# Patient Record
Sex: Female | Born: 1985
Health system: Southern US, Community
[De-identification: ages and names within clinical notes are randomized; demographics above are authoritative.]

## PROBLEM LIST (undated history)

## (undated) DIAGNOSIS — K219 Gastro-esophageal reflux disease without esophagitis: Secondary | ICD-10-CM

## (undated) DIAGNOSIS — B019 Varicella without complication: Secondary | ICD-10-CM

## (undated) DIAGNOSIS — Z9889 Other specified postprocedural states: Secondary | ICD-10-CM

## (undated) DIAGNOSIS — N390 Urinary tract infection, site not specified: Secondary | ICD-10-CM

## (undated) DIAGNOSIS — O24419 Gestational diabetes mellitus in pregnancy, unspecified control: Secondary | ICD-10-CM

## (undated) DIAGNOSIS — R7611 Nonspecific reaction to tuberculin skin test without active tuberculosis: Secondary | ICD-10-CM

## (undated) HISTORY — DX: Urinary tract infection, site not specified: N39.0

## (undated) HISTORY — DX: Varicella without complication: B01.9

## (undated) HISTORY — DX: Nonspecific reaction to tuberculin skin test without active tuberculosis: R76.11

---

## 2015-10-18 ENCOUNTER — Ambulatory Visit: Payer: Self-pay | Admitting: Primary Care

## 2015-10-22 ENCOUNTER — Encounter: Payer: Self-pay | Admitting: Primary Care

## 2015-10-22 ENCOUNTER — Ambulatory Visit (INDEPENDENT_AMBULATORY_CARE_PROVIDER_SITE_OTHER): Payer: Managed Care, Other (non HMO) | Admitting: Primary Care

## 2015-10-22 VITALS — BP 118/72 | HR 73 | Temp 98.0°F | Ht 59.5 in | Wt 119.1 lb

## 2015-10-22 DIAGNOSIS — Z7189 Other specified counseling: Secondary | ICD-10-CM

## 2015-10-22 DIAGNOSIS — Z7689 Persons encountering health services in other specified circumstances: Secondary | ICD-10-CM

## 2015-10-22 NOTE — Progress Notes (Signed)
Pre visit review using our clinic review tool, if applicable. No additional management support is needed unless otherwise documented below in the visit note. 

## 2015-10-22 NOTE — Progress Notes (Signed)
Subjective:    Patient ID: Dawn CrosbyJosephine Rivers, female    DOB: 04/18/1986, 30 y.o.   MRN: [id number]  HPI  Dawn Rivers is a 30 year old female who presents today to establish care. Will obtain old records. Her last physical was years ago. Her last pap was last week and was normal. She has no complaints today.  1) Positive TB Test: Tested positive in 2008 after traveling, was on INH for 9 months. Chest xray negative. Never tested positive since. She completes an annual TB quantiferion blood testing. Denies cough, night sweats, fatigue.  Review of Systems  Constitutional: Negative for unexpected weight change.  HENT: Negative for rhinorrhea.   Respiratory: Negative for cough and shortness of breath.   Cardiovascular: Negative for chest pain.  Gastrointestinal: Negative for diarrhea and constipation.  Genitourinary:       Spotting periods, IUD  Musculoskeletal: Negative for myalgias and arthralgias.  Allergic/Immunologic: Negative for environmental allergies.  Neurological: Negative for dizziness, numbness and headaches.  Psychiatric/Behavioral:       Denies concerns for anxiety or depression       Past Medical History  Diagnosis Date  . Urinary tract infection   . Chicken pox   . Positive TB test     Social History   Social History  . Marital Status: Married    Spouse Name: N/A  . Number of Children: N/A  . Years of Education: N/A   Occupational History  . Not on file.   Social History Main Topics  . Smoking status: Current Some Day Smoker    Types: Cigarettes  . Smokeless tobacco: Not on file     Comment: 2 to 3 in a few days  . Alcohol Use: No  . Drug Use: Not on file  . Sexual Activity: Not on file   Other Topics Concern  . Not on file   Social History Narrative   Married.   1 child.   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   Spending time with her family, running, reading.        Past Surgical History  Procedure Laterality Date  . Cesarean section       Family History  Problem Relation Age of Onset  . Hypertension Mother   . Hypertension Father   . Diabetes Father   . Hyperlipidemia Father   . Sleep apnea Father   . GER disease Father   . Hypertension Brother     No Known Allergies  No current outpatient prescriptions on file prior to visit.   No current facility-administered medications on file prior to visit.    BP 118/72 mmHg  Pulse 73  Temp(Src) 98 F (36.7 C) (Oral)  Ht 4' 11.5" (1.511 m)  Wt 119 lb 1.9 oz (54.032 kg)  BMI 23.67 kg/m2  SpO2 99%    Objective:   Physical Exam  Constitutional: She is oriented to person, place, and time. She appears well-nourished.  Cardiovascular: Normal rate and regular rhythm.   Pulmonary/Chest: Effort normal and breath sounds normal.  Neurological: She is alert and oriented to person, place, and time.  Skin: Skin is warm and dry.  Psychiatric: She has a normal mood and affect.          Assessment & Plan:  Establish Care:  No recent care from PCP. Last physical several years ago. Follows with GYN, last Pap one week ago and was normal per patient. No PMH. Reviewed FH and Social History. Follow up in 3-6  months for physical.

## 2015-10-22 NOTE — Patient Instructions (Signed)
Please schedule a physical with me within 3-6 months. You may also schedule a lab only appointment 3-4 days prior. We will discuss your lab results in detail during your physical.  It was a pleasure to meet you today! Please don't hesitate to call me with any questions. Welcome to [facility]!

## 2016-01-16 ENCOUNTER — Other Ambulatory Visit: Payer: Self-pay | Admitting: Primary Care

## 2016-01-16 DIAGNOSIS — Z0000 Encounter for general adult medical examination without abnormal findings: Secondary | ICD-10-CM

## 2016-01-20 ENCOUNTER — Other Ambulatory Visit: Payer: Managed Care, Other (non HMO)

## 2016-01-20 ENCOUNTER — Other Ambulatory Visit (INDEPENDENT_AMBULATORY_CARE_PROVIDER_SITE_OTHER): Payer: Managed Care, Other (non HMO)

## 2016-01-20 DIAGNOSIS — Z0000 Encounter for general adult medical examination without abnormal findings: Secondary | ICD-10-CM | POA: Diagnosis not present

## 2016-01-20 LAB — CBC
HCT: 41.4 % (ref 36.0–46.0)
HEMOGLOBIN: 14 g/dL (ref 12.0–15.0)
MCHC: 33.8 g/dL (ref 30.0–36.0)
MCV: 90.9 fl (ref 78.0–100.0)
Platelets: 287 10*3/uL (ref 150.0–400.0)
RBC: 4.55 Mil/uL (ref 3.87–5.11)
RDW: 12.1 % (ref 11.5–15.5)
WBC: 10.1 10*3/uL (ref 4.0–10.5)

## 2016-01-20 LAB — TSH: TSH: 1.13 u[IU]/mL (ref 0.35–4.50)

## 2016-01-20 LAB — COMPREHENSIVE METABOLIC PANEL
ALT: 337 U/L — AB (ref 0–35)
AST: 182 U/L — ABNORMAL HIGH (ref 0–37)
Albumin: 4.6 g/dL (ref 3.5–5.2)
Alkaline Phosphatase: 97 U/L (ref 39–117)
BILIRUBIN TOTAL: 1 mg/dL (ref 0.2–1.2)
BUN: 9 mg/dL (ref 6–23)
CHLORIDE: 102 meq/L (ref 96–112)
CO2: 28 meq/L (ref 19–32)
Calcium: 9.5 mg/dL (ref 8.4–10.5)
Creatinine, Ser: 0.53 mg/dL (ref 0.40–1.20)
GFR: 143.15 mL/min (ref >60.00)
GLUCOSE: 86 mg/dL (ref 70–99)
Potassium: 3.8 mEq/L (ref 3.5–5.1)
Sodium: 137 mEq/L (ref 135–145)
Total Protein: 7.6 g/dL (ref 6.0–8.3)

## 2016-01-20 LAB — LIPID PANEL
CHOL/HDL RATIO: 3
Cholesterol: 169 mg/dL (ref 0–200)
HDL: 48.6 mg/dL (ref >39.00)
LDL CALC: 103 mg/dL — AB (ref 0–99)
NONHDL: 120.53
Triglycerides: 88 mg/dL (ref 0.0–149.0)
VLDL: 17.6 mg/dL (ref 0.0–40.0)

## 2016-01-24 ENCOUNTER — Encounter: Payer: Self-pay | Admitting: Primary Care

## 2016-01-24 ENCOUNTER — Other Ambulatory Visit: Payer: Self-pay | Admitting: Primary Care

## 2016-01-24 ENCOUNTER — Ambulatory Visit (INDEPENDENT_AMBULATORY_CARE_PROVIDER_SITE_OTHER): Payer: Managed Care, Other (non HMO) | Admitting: Primary Care

## 2016-01-24 VITALS — BP 106/64 | HR 80 | Temp 98.7°F | Wt 123.0 lb

## 2016-01-24 DIAGNOSIS — Z0001 Encounter for general adult medical examination with abnormal findings: Secondary | ICD-10-CM

## 2016-01-24 DIAGNOSIS — Z0000 Encounter for general adult medical examination without abnormal findings: Secondary | ICD-10-CM

## 2016-01-24 DIAGNOSIS — R945 Abnormal results of liver function studies: Principal | ICD-10-CM

## 2016-01-24 DIAGNOSIS — R7989 Other specified abnormal findings of blood chemistry: Secondary | ICD-10-CM

## 2016-01-24 HISTORY — DX: Other specified abnormal findings of blood chemistry: R79.89

## 2016-01-24 LAB — HEPATIC FUNCTION PANEL
ALBUMIN: 4.4 g/dL (ref 3.5–5.2)
ALK PHOS: 90 U/L (ref 39–117)
ALT: 171 U/L — ABNORMAL HIGH (ref 0–35)
AST: 48 U/L — ABNORMAL HIGH (ref 0–37)
BILIRUBIN DIRECT: 0.1 mg/dL (ref 0.0–0.3)
BILIRUBIN TOTAL: 0.7 mg/dL (ref 0.2–1.2)
Total Protein: 7.3 g/dL (ref 6.0–8.3)

## 2016-01-24 NOTE — Assessment & Plan Note (Signed)
Tdap and Flu UTD. Diet is fair, some junk food consumption during night shifts at work. Exam unremarkable. Labs with elevated LFTs, repeat today. Discussed the importance of a healthy diet and regular exercise to reduce risk of other medical diseases.  Follow up in 1 year for repeat physical.

## 2016-01-24 NOTE — Progress Notes (Signed)
Pre visit review using our clinic review tool, if applicable. No additional management support is needed unless otherwise documented below in the visit note. 

## 2016-01-24 NOTE — Progress Notes (Signed)
Subjective:    Patient ID: Avilyn Virtue, female    DOB: 1986/08/27, 30 y.o.   MRN: [id number]  HPI  Ms. Haseley is a 30 year old female who presents today for complete physical.  Immunizations: -Tetanus: Completed in 2013 -Influenza: Completed in the Fall 2016   Diet: She endorses a fair diet. She works night shift as a Engineer, civil (consulting) and doesn't eat at regular hours of the day. Breakfast: Skips Lunch: Rice, meat, vegetables Dinner: Skips Snacks: Chips, vending machine food, subway Desserts: None Beverages: Water, juice, occasional soda, coffe  Exercise: She does not currently exercise Eye exam: Completed in 2016.  Dental exam: Completed in 2016. Pap Smear: Completed in December 2016   Review of Systems  Constitutional: Negative for fever.  HENT: Positive for congestion. Negative for sinus pressure and sore throat.   Respiratory: Positive for cough. Negative for shortness of breath.   Cardiovascular: Negative for chest pain.  Gastrointestinal: Negative for diarrhea.       Occasional constipation, bowel movements are every other day on average.   Genitourinary:       IUD.   Allergic/Immunologic: Negative for environmental allergies.  Neurological: Negative for dizziness, numbness and headaches.  Psychiatric/Behavioral:       Denies concerns for anxiety or depression       Past Medical History  Diagnosis Date  . Urinary tract infection   . Chicken pox   . Positive TB test      Social History   Social History  . Marital Status: Married    Spouse Name: N/A  . Number of Children: N/A  . Years of Education: N/A   Occupational History  . Not on file.   Social History Main Topics  . Smoking status: Current Some Day Smoker    Types: Cigarettes  . Smokeless tobacco: Not on file     Comment: 2 to 3 in a few days  . Alcohol Use: No  . Drug Use: Not on file  . Sexual Activity: Not on file   Other Topics Concern  . Not on file   Social History Narrative   Married.   1 child.   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   Spending time with her family, running, reading.        Past Surgical History  Procedure Laterality Date  . Cesarean section      Family History  Problem Relation Age of Onset  . Hypertension Mother   . Hypertension Father   . Diabetes Father   . Hyperlipidemia Father   . Sleep apnea Father   . GER disease Father   . Hypertension Brother     No Known Allergies  No current outpatient prescriptions on file prior to visit.   No current facility-administered medications on file prior to visit.    BP 106/64 mmHg  Pulse 80  Temp(Src) 98.7 F (37.1 C) (Oral)  Wt 123 lb (55.792 kg)  SpO2 99%    Objective:   Physical Exam  Constitutional: She is oriented to person, place, and time. She appears well-nourished.  HENT:  Right Ear: Tympanic membrane and ear canal normal.  Left Ear: Tympanic membrane and ear canal normal.  Nose: Nose normal.  Mouth/Throat: Oropharynx is clear and moist.  Eyes: Conjunctivae and EOM are normal. Pupils are equal, round, and reactive to light.  Neck: Neck supple. No thyromegaly present.  Cardiovascular: Normal rate and regular rhythm.   No murmur heard. Pulmonary/Chest: Effort normal and breath sounds  normal. She has no rales.  Abdominal: Soft. Bowel sounds are normal. There is no tenderness.  Musculoskeletal: Normal range of motion.  Lymphadenopathy:    She has no cervical adenopathy.  Neurological: She is alert and oriented to person, place, and time. She has normal reflexes. No cranial nerve deficit.  Skin: Skin is warm and dry. No rash noted.  Psychiatric: She has a normal mood and affect.          Assessment & Plan:

## 2016-01-24 NOTE — Assessment & Plan Note (Signed)
AST and ALT both markedly elevated. She's had a recent cold and has been taking tylenol every 4 hours. No tylenol since Thursday last week. Will repeat today, if remain elevated, will complete abdominal ultrasound of liver.

## 2016-01-24 NOTE — Patient Instructions (Signed)
Complete lab work prior to leaving today. I will notify you of your results once received.   Continue your consumption of vegetables and water. Limit fruit juices as they can contain added sugar. Reduce consumption of junk food.  Start exercising. You should be getting 1 hour of moderate intensity exercise 5 days weekly.  Follow up in 1 year for repeat physical or sooner if needed.   It was a pleasure to see you today!

## 2016-02-03 ENCOUNTER — Other Ambulatory Visit (INDEPENDENT_AMBULATORY_CARE_PROVIDER_SITE_OTHER): Payer: Managed Care, Other (non HMO)

## 2016-02-03 ENCOUNTER — Encounter: Payer: Self-pay | Admitting: *Deleted

## 2016-02-03 DIAGNOSIS — R7989 Other specified abnormal findings of blood chemistry: Secondary | ICD-10-CM | POA: Diagnosis not present

## 2016-02-03 DIAGNOSIS — R945 Abnormal results of liver function studies: Principal | ICD-10-CM

## 2016-02-03 LAB — HEPATIC FUNCTION PANEL
ALBUMIN: 4.4 g/dL (ref 3.5–5.2)
ALK PHOS: 78 U/L (ref 39–117)
ALT: 77 U/L — ABNORMAL HIGH (ref 0–35)
AST: 28 U/L (ref 0–37)
Bilirubin, Direct: 0.1 mg/dL (ref 0.0–0.3)
TOTAL PROTEIN: 7.2 g/dL (ref 6.0–8.3)
Total Bilirubin: 0.5 mg/dL (ref 0.2–1.2)

## 2016-02-08 ENCOUNTER — Telehealth: Payer: Self-pay | Admitting: Primary Care

## 2016-02-08 ENCOUNTER — Encounter: Payer: Self-pay | Admitting: *Deleted

## 2016-02-08 NOTE — Telephone Encounter (Signed)
Patient would like the results from her recent labs.

## 2016-02-08 NOTE — Telephone Encounter (Signed)
Called and notified patient of Dawn Rivers's comments. Patient verbalized understanding.  

## 2016-10-06 DIAGNOSIS — Z130 Encounter for screening for diseases of the blood and blood-forming organs and certain disorders involving the immune mechanism: Secondary | ICD-10-CM | POA: Diagnosis not present

## 2016-10-06 DIAGNOSIS — Z30431 Encounter for routine checking of intrauterine contraceptive device: Secondary | ICD-10-CM | POA: Diagnosis not present

## 2016-10-06 DIAGNOSIS — Z1389 Encounter for screening for other disorder: Secondary | ICD-10-CM | POA: Diagnosis not present

## 2016-10-06 DIAGNOSIS — Z01419 Encounter for gynecological examination (general) (routine) without abnormal findings: Secondary | ICD-10-CM | POA: Diagnosis not present

## 2016-10-06 DIAGNOSIS — Z6824 Body mass index (BMI) 24.0-24.9, adult: Secondary | ICD-10-CM | POA: Diagnosis not present

## 2017-04-12 ENCOUNTER — Emergency Department (HOSPITAL_COMMUNITY): Payer: 59

## 2017-04-12 ENCOUNTER — Emergency Department (HOSPITAL_COMMUNITY)
Admission: EM | Admit: 2017-04-12 | Discharge: 2017-04-13 | Disposition: A | Discharge status: Home / Self Care | Payer: 59 | Attending: Emergency Medicine | Admitting: Emergency Medicine

## 2017-04-12 ENCOUNTER — Encounter (HOSPITAL_COMMUNITY): Payer: Self-pay | Admitting: Nurse Practitioner

## 2017-04-12 ENCOUNTER — Other Ambulatory Visit: Payer: Self-pay

## 2017-04-12 DIAGNOSIS — F17210 Nicotine dependence, cigarettes, uncomplicated: Secondary | ICD-10-CM | POA: Insufficient documentation

## 2017-04-12 DIAGNOSIS — R252 Cramp and spasm: Secondary | ICD-10-CM | POA: Diagnosis not present

## 2017-04-12 DIAGNOSIS — Z79899 Other long term (current) drug therapy: Secondary | ICD-10-CM | POA: Diagnosis not present

## 2017-04-12 DIAGNOSIS — R202 Paresthesia of skin: Secondary | ICD-10-CM | POA: Insufficient documentation

## 2017-04-12 DIAGNOSIS — R824 Acetonuria: Secondary | ICD-10-CM | POA: Insufficient documentation

## 2017-04-12 DIAGNOSIS — E872 Acidosis, unspecified: Secondary | ICD-10-CM

## 2017-04-12 DIAGNOSIS — R0602 Shortness of breath: Secondary | ICD-10-CM | POA: Diagnosis not present

## 2017-04-12 LAB — URINALYSIS, ROUTINE W REFLEX MICROSCOPIC
BILIRUBIN URINE: NEGATIVE
Glucose, UA: 150 mg/dL — AB
Hgb urine dipstick: NEGATIVE
Ketones, ur: 80 mg/dL — AB
Leukocytes, UA: NEGATIVE
NITRITE: NEGATIVE
Protein, ur: NEGATIVE mg/dL
SPECIFIC GRAVITY, URINE: 1.017 (ref 1.005–1.030)
pH: 6 (ref 5.0–8.0)

## 2017-04-12 LAB — RAPID URINE DRUG SCREEN, HOSP PERFORMED
Amphetamines: NOT DETECTED
BARBITURATES: NOT DETECTED
BENZODIAZEPINES: NOT DETECTED
COCAINE: NOT DETECTED
OPIATES: NOT DETECTED
Tetrahydrocannabinol: NOT DETECTED

## 2017-04-12 LAB — HEPATIC FUNCTION PANEL
ALBUMIN: 4.3 g/dL (ref 3.5–5.0)
ALT: 19 U/L (ref 14–54)
AST: 21 U/L (ref 15–41)
Alkaline Phosphatase: 57 U/L (ref 38–126)
BILIRUBIN TOTAL: 0.9 mg/dL (ref 0.3–1.2)
Bilirubin, Direct: 0.1 mg/dL (ref 0.1–0.5)
Indirect Bilirubin: 0.8 mg/dL (ref 0.3–0.9)
Total Protein: 6.6 g/dL (ref 6.5–8.1)

## 2017-04-12 LAB — BASIC METABOLIC PANEL
ANION GAP: 16 — AB (ref 5–15)
ANION GAP: 8 (ref 5–15)
BUN: 9 mg/dL (ref 6–20)
BUN: 7 mg/dL (ref 6–20)
CALCIUM: 9 mg/dL (ref 8.9–10.3)
CHLORIDE: 108 mmol/L (ref 101–111)
CO2: 18 mmol/L — ABNORMAL LOW (ref 22–32)
CO2: 20 mmol/L — AB (ref 22–32)
Calcium: 8.1 mg/dL — ABNORMAL LOW (ref 8.9–10.3)
Chloride: 100 mmol/L — ABNORMAL LOW (ref 101–111)
Creatinine, Ser: 0.71 mg/dL (ref 0.44–1.00)
Creatinine, Ser: 0.5 mg/dL (ref 0.44–1.00)
GFR calc Af Amer: >60 mL/min (ref >60)
GLUCOSE: 67 mg/dL (ref 65–99)
Glucose, Bld: 201 mg/dL — ABNORMAL HIGH (ref 65–99)
POTASSIUM: 3.6 mmol/L (ref 3.5–5.1)
Potassium: 3.9 mmol/L (ref 3.5–5.1)
Sodium: 134 mmol/L — ABNORMAL LOW (ref 135–145)
Sodium: 136 mmol/L (ref 135–145)

## 2017-04-12 LAB — CBC
HCT: 45.4 % (ref 36.0–46.0)
HEMOGLOBIN: 15 g/dL (ref 12.0–15.0)
MCH: 30.4 pg (ref 26.0–34.0)
MCHC: 33 g/dL (ref 30.0–36.0)
MCV: 92.1 fL (ref 78.0–100.0)
Platelets: 317 10*3/uL (ref 150–400)
RBC: 4.93 MIL/uL (ref 3.87–5.11)
RDW: 12 % (ref 11.5–15.5)
WBC: 10.8 10*3/uL — ABNORMAL HIGH (ref 4.0–10.5)

## 2017-04-12 LAB — MAGNESIUM: Magnesium: 1.9 mg/dL (ref 1.7–2.4)

## 2017-04-12 LAB — I-STAT VENOUS BLOOD GAS, ED
Acid-base deficit: 3 mmol/L — ABNORMAL HIGH (ref 0.0–2.0)
BICARBONATE: 20.9 mmol/L (ref 20.0–28.0)
O2 Saturation: 75 %
PH VEN: 7.432 — AB (ref 7.250–7.430)
TCO2: 22 mmol/L (ref 0–100)
pCO2, Ven: 31.2 mmHg — ABNORMAL LOW (ref 44.0–60.0)
pO2, Ven: 38 mmHg (ref 32.0–45.0)

## 2017-04-12 LAB — CK: CK TOTAL: 70 U/L (ref 38–234)

## 2017-04-12 LAB — T4, FREE: Free T4: 1.08 ng/dL (ref 0.61–1.12)

## 2017-04-12 LAB — TSH: TSH: 0.403 u[IU]/mL (ref 0.350–4.500)

## 2017-04-12 LAB — I-STAT CG4 LACTIC ACID, ED: LACTIC ACID, VENOUS: 0.96 mmol/L (ref 0.5–1.9)

## 2017-04-12 LAB — URIC ACID: URIC ACID, SERUM: 7.1 mg/dL — AB (ref 2.3–6.6)

## 2017-04-12 LAB — PREGNANCY, URINE: PREG TEST UR: NEGATIVE

## 2017-04-12 LAB — VITAMIN B12: Vitamin B-12: 340 pg/mL (ref 180–914)

## 2017-04-12 MED ORDER — SODIUM CHLORIDE 0.9 % IV BOLUS (SEPSIS)
1000.0000 mL | Freq: Once | INTRAVENOUS | Status: AC
  Administered 2017-04-12: 1000 mL via INTRAVENOUS

## 2017-04-12 MED ORDER — ALBUTEROL SULFATE (2.5 MG/3ML) 0.083% IN NEBU: 5.0000 mg | INHALATION_SOLUTION | Freq: Once | RESPIRATORY_TRACT | Status: DC

## 2017-04-12 NOTE — ED Notes (Signed)
Dr Dalene SeltzerSchlossman given a copy of venous blood gas results

## 2017-04-12 NOTE — ED Triage Notes (Addendum)
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 Patient anxious- denies headache, vision changes, speech changes, chest pain, dizziness, lightheadedness, fevers or chills, weakness.

## 2017-04-12 NOTE — ED Provider Notes (Signed)
[facility]-EMERGENCY DEPT Provider Note   CSN: [id number] Arrival date & time: 04/12/17  1900     History   Chief Complaint Chief Complaint  Patient presents with  . Arm Pain    HPI Dawn Rivers is a 31 y.o. female.    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 Reports that symptoms began around 5:30, prior to her going to work. Reports that she drank a red bull prior to this.  Has been doing a ketogenic diet with her husband for the last month, however reports "cheating" on her diet at times.  Feels like she cannot open her hands, and initially felt like she could not open her lips or mouth. Reports tingling across the face, lips, bilateral upper and lower extremities. Has never had symptoms like this before. Also describes cramping, and pain with movement.  Reports she is up-to-date on her tetanus vaccine. Denies any recent trauma. Denies any other new medications, or drugs. Denies possibility of withdrawal from medications or drugs. Denies being under any increased stress. Reports that she's had red bulls before. Denies family history of MS. Does report her father has diabetes.   Past Medical History:  Diagnosis Date  . Chicken pox   . Positive TB test   . Urinary tract infection     Patient Active Problem List   Diagnosis Date Noted  . Elevated LFTs 01/24/2016  . Preventative health care 01/24/2016    Past Surgical History:  Procedure Laterality Date  . CESAREAN SECTION      OB History    No data available       Home Medications    Prior to Admission medications   Medication Sig Start Date End Date Taking? Authorizing Provider  levonorgestrel (MIRENA, 52 MG,) 20 MCG/24HR IUD Inject 20 mcg as directed once. 08/19/12  Yes [provider]    Family History Family History  Problem  Relation Age of Onset  . Hypertension Mother   . Hypertension Father   . Diabetes Father   . Hyperlipidemia Father   . Sleep apnea Father   . GER disease Father   . Hypertension Brother     Social History Social History  Substance Use Topics  . Smoking status: Current Some Day Smoker    Types: Cigarettes  . Smokeless tobacco: Not on file     Comment: 2 to 3 in a few days  . Alcohol use No     Allergies   Patient has no known allergies.   Review of Systems Review of Systems  Constitutional: Positive for fatigue. Negative for fever.  HENT: Negative for sore throat.   Eyes: Negative for visual disturbance.  Respiratory: Negative for cough and shortness of breath.   Cardiovascular: Negative for chest pain.  Gastrointestinal: Negative for abdominal pain, nausea and vomiting.  Genitourinary: Negative for difficulty urinating.  Musculoskeletal: Negative for back pain and neck pain.  Skin: Negative for rash.  Neurological: Positive for dizziness, light-headedness and numbness. Negative for syncope, speech difficulty, weakness and headaches.     Physical Exam Updated Vital Signs BP 108/73   Pulse 80   Temp 98.2 F (36.8 C) (Oral)   Resp 10   SpO2 99%   Physical Exam  Constitutional: She is oriented to person, place, and time. She appears well-developed and well-nourished. No distress.  HENT:  Head: Normocephalic and atraumatic.  Eyes: Conjunctivae and EOM are normal.  Neck: Normal range of motion.  Cardiovascular: Normal rate, regular rhythm, normal heart sounds and intact distal pulses.  Exam reveals no gallop and no friction rub.   No murmur heard. Pulmonary/Chest: Effort normal and breath sounds normal. No respiratory distress. She has no wheezes. She has no rales.  Abdominal: Soft. She exhibits no distension. There is no tenderness. There is no guarding.  Musculoskeletal: She exhibits no edema or tenderness.  Neurological: She is alert and oriented to person,  place, and time.  Hands held in clenched fists bilaterally, feet held in plantar flexion Will open hands with coaxing, then hands shake and closes again Finger to nose WNL with exception of fine intention tremor.  Normal EOM, symmetric facies.  Normal speech, palate rise.  Normal strength upper and lower extremities  Skin: Skin is warm and dry. No rash noted. She is not diaphoretic. No erythema.  Nursing note and vitals reviewed.    ED Treatments / Results  Labs (all labs ordered are listed, but only abnormal results are displayed) Labs Reviewed  BASIC METABOLIC PANEL - Abnormal; Notable for the following:       Result Value   Sodium 134 (*)    Chloride 100 (*)    CO2 18 (*)    Glucose, Bld 201 (*)    Anion gap 16 (*)    All other components within normal limits  CBC - Abnormal; Notable for the following:    WBC 10.8 (*)    All other components within normal limits  URINALYSIS, ROUTINE W REFLEX MICROSCOPIC - Abnormal; Notable for the following:    Glucose, UA 150 (*)    Ketones, ur 80 (*)    All other components within normal limits  URIC ACID - Abnormal; Notable for the following:    Uric Acid, Serum 7.1 (*)    All other components within normal limits  BASIC METABOLIC PANEL - Abnormal; Notable for the following:    CO2 20 (*)    Calcium 8.1 (*)    All other components within normal limits  I-STAT VENOUS BLOOD GAS, ED - Abnormal; Notable for the following:    pH, Ven 7.432 (*)    pCO2, Ven 31.2 (*)    Acid-base deficit 3.0 (*)    All other components within normal limits  CK  PREGNANCY, URINE  MAGNESIUM  HEPATIC FUNCTION PANEL  VITAMIN B12  TSH  T4, FREE  RAPID URINE DRUG SCREEN, HOSP PERFORMED  I-STAT CG4 LACTIC ACID, ED    EKG  EKG Interpretation None       Radiology Dg Chest 2 View  Result Date: 04/12/2017 CLINICAL DATA:  Acute shortness of breath and bilateral arm and leg tingling. EXAM: CHEST  2 VIEW COMPARISON:  None. FINDINGS: The heart size and  mediastinal contours are within normal limits. Both lungs are clear. The visualized skeletal structures are unremarkable. IMPRESSION: No active cardiopulmonary disease. Electronically Signed   By: Harmon PierJeffrey  Hu M.D.   On: 04/12/2017 19:56    Procedures Procedures (including critical care time)  Medications Ordered in ED Medications  sodium chloride 0.9 % bolus 1,000 mL (0 mLs Intravenous Stopped 04/12/17 2309)     Initial Impression / Assessment and Plan / ED Course  I have reviewed the triage vital signs and the nursing notes.  Pertinent labs & imaging results that were available during my care of the patient were reviewed by me and considered in  my medical decision making (see chart for details).     31 year old female with no significant medical history presents with concern for bilateral upper extremity, lower extremity, and facial tingling, as well as spasm of her bilateral upper arms and legs and lightheadedness. Symptoms not within a focal distribution to suggest central neurologic etiology. Discussed with neurology, also do not feel symptoms are consistent with MS. Feel symptoms are most likely metabolic abnormality versus possible anxiety attack.  Labs completed showed negative pregnancy test, no significant electrolyte abnormalities, including normal magnesium, normal potassium, normal LFTs. B-12 and TSH were checked and normal. CK also evaluated given cramping and was negative.  Labs were significant for mild acidosis, with a bicarbonate of 18, anion gap of 16, glucose of 201, and ketones in the urine.  Patient does not have a history of diabetes, and had a red bull prior to this evaluation, and feel the glucose is likely elevated in setting of recent sugar intake. The ketones in the urine mostly secondary to her ketogenic diet and doubt DKA. Checked uric acid (ketogenic diet may cause abnormalities of this) which showed uric acid is 7.1, however no significant elevation to necessitate  treatment of asymptomatic hyperuricemia.  It is possible that her ketogenic diet has contributed to a metabolic acidosis, and that this combined with caffeine intake may have brought on her symptoms. Discussed that anxiety and panic attack is also possibility. Recommend given lab abnormalities, patient returned to normal diet, avoid caffeine intake. In addition, given her hyperglycemia, recommend close primary care physician follow-up for recheck of her glucose. Patient given IVF with closure of anion gap, improvement in symptoms.  Patient discharged in stable condition with understanding of reasons to return.    Final Clinical Impressions(s) / ED Diagnoses   Final diagnoses:  Tingling in extremities  Muscle cramps  Metabolic acidosis, now resolved  Ketonuria    New Prescriptions Discharge Medication List as of 04/13/2017 12:10 AM       Alvira Monday, MD 04/13/17 [phone]

## 2017-04-12 NOTE — ED Notes (Signed)
LKW 1500. Pt woke up approx 1730 "not feeling normal" Pt reports bilateral arm and leg tingling, facial tingling. Pt hands are clenched into fists. Pt highly anxious and shaking in room. Pt denies HA, CP, SOB, denies significant medical history. Does report drinking 1 redbull before work.

## 2017-04-12 NOTE — ED Notes (Signed)
EDP at bedside  

## 2017-04-12 NOTE — ED Notes (Signed)
Called main lab to get update on repeat BMP and UDS

## 2017-04-12 NOTE — ED Notes (Signed)
EDP aware pt in room

## 2017-04-12 NOTE — ED Notes (Signed)
Called main lab for update on CK.

## 2017-04-13 NOTE — Discharge Instructions (Addendum)
Discontinue your ketogenic diet, avoid caffeine, and follow up with your physician regarding your glucose.

## 2017-04-27 ENCOUNTER — Ambulatory Visit (INDEPENDENT_AMBULATORY_CARE_PROVIDER_SITE_OTHER): Payer: 59 | Admitting: Primary Care

## 2017-04-27 ENCOUNTER — Encounter: Payer: Self-pay | Admitting: Primary Care

## 2017-04-27 VITALS — BP 106/72 | HR 82 | Temp 98.0°F | Ht 59.0 in | Wt 114.0 lb

## 2017-04-27 DIAGNOSIS — E878 Other disorders of electrolyte and fluid balance, not elsewhere classified: Secondary | ICD-10-CM

## 2017-04-27 DIAGNOSIS — R739 Hyperglycemia, unspecified: Secondary | ICD-10-CM | POA: Diagnosis not present

## 2017-04-27 LAB — BASIC METABOLIC PANEL
BUN: 9 mg/dL (ref 6–23)
CALCIUM: 9.5 mg/dL (ref 8.4–10.5)
CHLORIDE: 104 meq/L (ref 96–112)
CO2: 27 mEq/L (ref 19–32)
CREATININE: 0.59 mg/dL (ref 0.40–1.20)
GFR: 125.45 mL/min (ref >60.00)
Glucose, Bld: 94 mg/dL (ref 70–99)
Potassium: 3.8 mEq/L (ref 3.5–5.1)
Sodium: 137 mEq/L (ref 135–145)

## 2017-04-27 LAB — HEMOGLOBIN A1C: HEMOGLOBIN A1C: 4.5 % — AB (ref 4.6–6.5)

## 2017-04-27 NOTE — Progress Notes (Signed)
Subjective:    Patient ID: Dawn Rivers, female    DOB: 20-Aug-1986, 31 y.o.   MRN: [id number]  HPI  Ms. Manseau is a 31 year old female who presents today for emergency department follow up.  She presented to [facility] on 04/12/17 with a chief complaint of bilateral upper and lower extremity, and facial tingling with spas of her bilateral upper and lower extremities. Symptoms began 3-4 hours prior to her visit. She drank a red bull prior to her symptoms and has been following the ketogenic diet intermittently. She denied new medications, possible overdose of medications, family history of MS, anxiety/stress.  During her stay in the ED she underwent labs which showed mild acidosis, hyperglycemia (red bull drink prior to visit), anion gap, ketones in the urine. She also had an infusion of IV fluids with closure in her anion gap. Her symptoms were thought to be secondary to metabolic acidosis from the combination of her ketogenic diet and caffeine consumption. It was recommended she return to her normal diet, avoid caffeine, and follow up with PCP.  Since her ED visit her symptoms have completely dissipated. She denies polyuria, polydipsia, numbness/tingling, muscle spasms, chest pain. She is transitioning back to a normal diet.   Review of Systems  Constitutional: Negative for fatigue.  Respiratory: Negative for shortness of breath.   Cardiovascular: Negative for chest pain.  Musculoskeletal: Negative for myalgias.  Neurological: Negative for dizziness, weakness, numbness and headaches.       Past Medical History:  Diagnosis Date  . Chicken pox   . Positive TB test   . Urinary tract infection      Social History   Social History  . Marital status: Married    Spouse name: N/A  . Number of children: N/A  . Years of education: N/A   Occupational History  . Not on file.   Social History Main Topics  . Smoking status: Current Some Day Smoker    Types: Cigarettes  . Smokeless  tobacco: Never Used     Comment: 2 to 3 in a few days  . Alcohol use No  . Drug use: Unknown  . Sexual activity: Not on file   Other Topics Concern  . Not on file   Social History Narrative   Married.   1 child.   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   Spending time with her family, running, reading.        Past Surgical History:  Procedure Laterality Date  . CESAREAN SECTION      Family History  Problem Relation Age of Onset  . Hypertension Mother   . Hypertension Father   . Diabetes Father   . Hyperlipidemia Father   . Sleep apnea Father   . GER disease Father   . Hypertension Brother     No Known Allergies  Current Outpatient Prescriptions on File Prior to Visit  Medication Sig Dispense Refill  . levonorgestrel (MIRENA, 52 MG,) 20 MCG/24HR IUD Inject 20 mcg as directed once.     No current facility-administered medications on file prior to visit.     BP 106/72   Pulse 82   Temp 98 F (36.7 C) (Oral)   Ht 4\' 11"  (1.499 m)   Wt 114 lb (51.7 kg)   SpO2 98%   BMI 23.03 kg/m    Objective:   Physical Exam  Constitutional: She appears well-nourished.  Eyes: EOM are normal.  Neck: Neck supple.  Cardiovascular: Normal rate and regular  rhythm.   Pulmonary/Chest: Effort normal and breath sounds normal.  Neurological: No cranial nerve deficit.  Skin: Skin is warm and dry.          Assessment & Plan:  Emergency Department Follow Up:  Tingling/numbness/muscle spasms for several hours. Labs showing metabolic acidosis. Symptoms were thought to be secondary to Red Bull consumption and ketogenic diet. No symptoms since ED visit, doing well. Neuro exam today unremarkable. Will check BMP and A1C to ensure no other abnormalities. Discussed to refrain from ketogenic diet and Red Bull. Stay hydrated with water.  All hospital labs and notes reviewed. Morrie Sheldonlark,Asyia Hornung Kendal, NP

## 2017-04-27 NOTE — Patient Instructions (Signed)
Complete lab work prior to leaving today. I will notify you of your results once received.   Refrain from the ketogenic diet and Red Bull drinks.  Ensure you are consuming 64 ounces of water daily.  It was a pleasure to see you today!

## 2017-06-08 DIAGNOSIS — H5203 Hypermetropia, bilateral: Secondary | ICD-10-CM | POA: Diagnosis not present

## 2017-06-08 DIAGNOSIS — H52223 Regular astigmatism, bilateral: Secondary | ICD-10-CM | POA: Diagnosis not present

## 2017-07-20 ENCOUNTER — Encounter: Payer: Self-pay | Admitting: Family Medicine

## 2017-07-20 ENCOUNTER — Ambulatory Visit (INDEPENDENT_AMBULATORY_CARE_PROVIDER_SITE_OTHER): Payer: 59 | Admitting: Family Medicine

## 2017-07-20 VITALS — BP 106/62 | HR 80 | Temp 97.8°F | Wt 115.5 lb

## 2017-07-20 DIAGNOSIS — R300 Dysuria: Secondary | ICD-10-CM | POA: Diagnosis not present

## 2017-07-20 NOTE — Progress Notes (Signed)
   Subjective:    Patient ID: Dawn Rivers, female    DOB: 1985/10/14, 31 y.o.   MRN: [id number]  HPI This is a 31 yo female who presents today with dysuria and urinary frequency. 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 Started to have dysuria and frequency 2 days ago. Has been pushing fluids and feels much better today. No fever, chills, abdominal pain/nausea/vomiting or back pain. No vaginal discharge/itching/buring.    Past Medical History:  Diagnosis Date  . Chicken pox   . Positive TB test   . Urinary tract infection    Past Surgical History:  Procedure Laterality Date  . CESAREAN SECTION     Family History  Problem Relation Age of Onset  . Hypertension Mother   . Hypertension Father   . Diabetes Father   . Hyperlipidemia Father   . Sleep apnea Father   . GER disease Father   . Hypertension Brother    Social History  Substance Use Topics  . Smoking status: Current Some Day Smoker    Types: Cigarettes  . Smokeless tobacco: Never Used     Comment: 2 to 3 in a few days  . Alcohol use No      Review of Systems Per HPI    Objective:   Physical Exam Physical Exam  Vitals reviewed. Constitutional: Oriented to person, place, and time. Appears well-developed and well-nourished.  HENT:  Head: Normocephalic and atraumatic.  Eyes: Conjunctivae are normal.  Neck: Normal range of motion. Neck supple.  Cardiovascular: Normal rate.   Pulmonary/Chest: Effort normal.  Musculoskeletal: Normal range of motion.  Neurological: Alert and oriented to person, place, and time.  Skin: Skin is warm and dry.  Psychiatric: Normal mood and affect. Behavior is normal. Judgment and thought content normal.   BP 106/62   Pulse 80   Temp 97.8 F (36.6 C) (Oral)   Wt 115 lb 8 oz (52.4 kg)   SpO2 98%   BMI 23.33 kg/m  Wt Readings from Last 3 Encounters:  07/20/17 115 lb 8 oz (52.4 kg)  04/27/17 114 lb (51.7 kg)  01/24/16 123 lb (55.8 kg)   Results for orders  placed or performed in visit on 07/20/17  POCT Urinalysis Dipstick (Automated)  Result Value Ref Range   Color, UA yellow    Clarity, UA clear    Glucose, UA neg    Bilirubin, UA neg    Ketones, UA neg    Spec Grav, UA >=1.030 (A) 1.010 - 1.025   Blood, UA neg    pH, UA 6.0 5.0 - 8.0   Protein, UA neg    Urobilinogen, UA 0.2 0.2 or 1.0 E.U./dL   Nitrite, UA neg    Leukocytes, UA Negative Negative       Assessment & Plan:  1. Dysuria - urine clear, improved today following increased fluids and frequent urination. Suspect irritation related to concentrated urine and holding urine - she was instructed to continue to increase fluids, void frequently - RTC precautions reviewed - POCT Urinalysis Dipstick (Automated)   Olean Ree, FNP-BC  [facility], [facility]  07/20/2017 12:13 PM

## 2017-07-20 NOTE — Patient Instructions (Signed)
Continue to push fluids and urinate frequency  If continued symptoms. Please let me know.

## 2017-07-24 LAB — POC URINALSYSI DIPSTICK (AUTOMATED)
BILIRUBIN UA: NEGATIVE
Blood, UA: NEGATIVE
Glucose, UA: NEGATIVE
Ketones, UA: NEGATIVE
LEUKOCYTES UA: NEGATIVE
NITRITE UA: NEGATIVE
PH UA: 6 (ref 5.0–8.0)
Protein, UA: NEGATIVE
Spec Grav, UA: >=1.03 — AB (ref 1.010–1.025)
Urobilinogen, UA: 0.2 E.U./dL

## 2017-08-16 DIAGNOSIS — Z30433 Encounter for removal and reinsertion of intrauterine contraceptive device: Secondary | ICD-10-CM | POA: Diagnosis not present

## 2017-11-05 DIAGNOSIS — Z130 Encounter for screening for diseases of the blood and blood-forming organs and certain disorders involving the immune mechanism: Secondary | ICD-10-CM | POA: Diagnosis not present

## 2017-11-05 DIAGNOSIS — Z01419 Encounter for gynecological examination (general) (routine) without abnormal findings: Secondary | ICD-10-CM | POA: Diagnosis not present

## 2017-11-05 DIAGNOSIS — Z30431 Encounter for routine checking of intrauterine contraceptive device: Secondary | ICD-10-CM | POA: Diagnosis not present

## 2017-11-05 DIAGNOSIS — Z1389 Encounter for screening for other disorder: Secondary | ICD-10-CM | POA: Diagnosis not present

## 2018-07-21 IMAGING — CR DG CHEST 2V
2 series · 2 of 2 positions shown · non-contrast
Comparison: None.

CLINICAL DATA: Acute shortness of breath and bilateral arm and leg
tingling.

EXAM:
CHEST  2 VIEW

[chest lat]
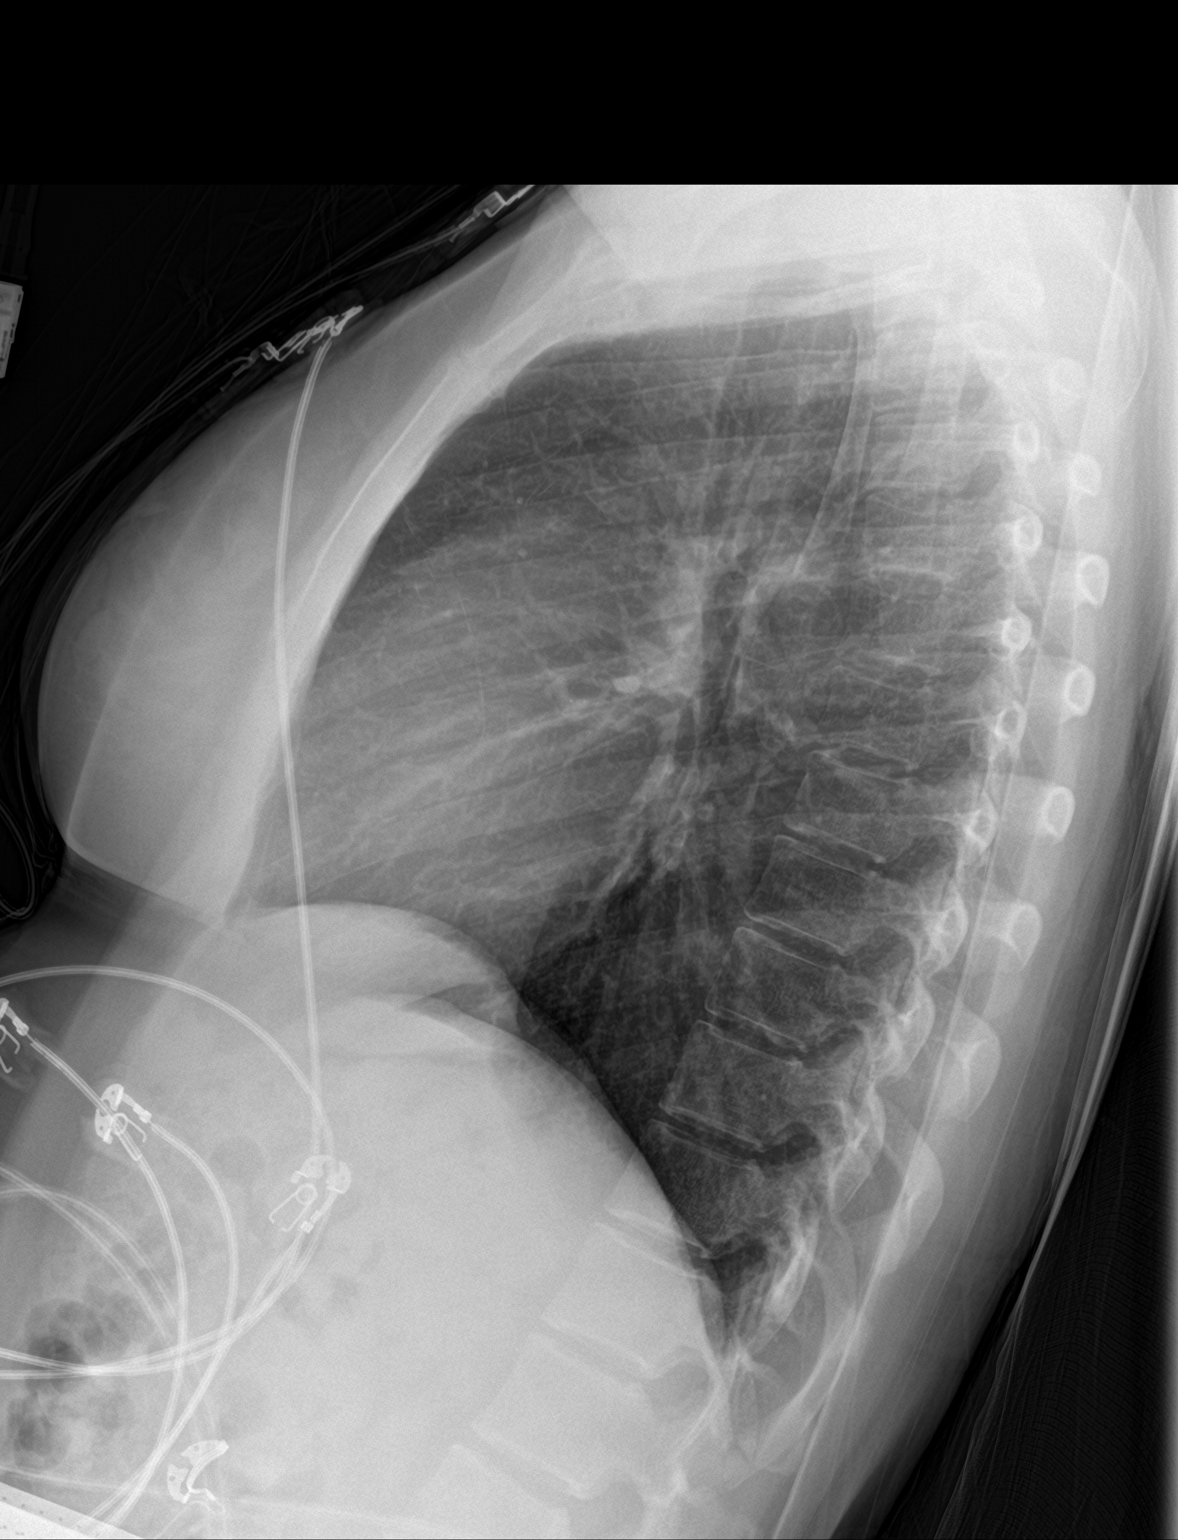

[chest ap]
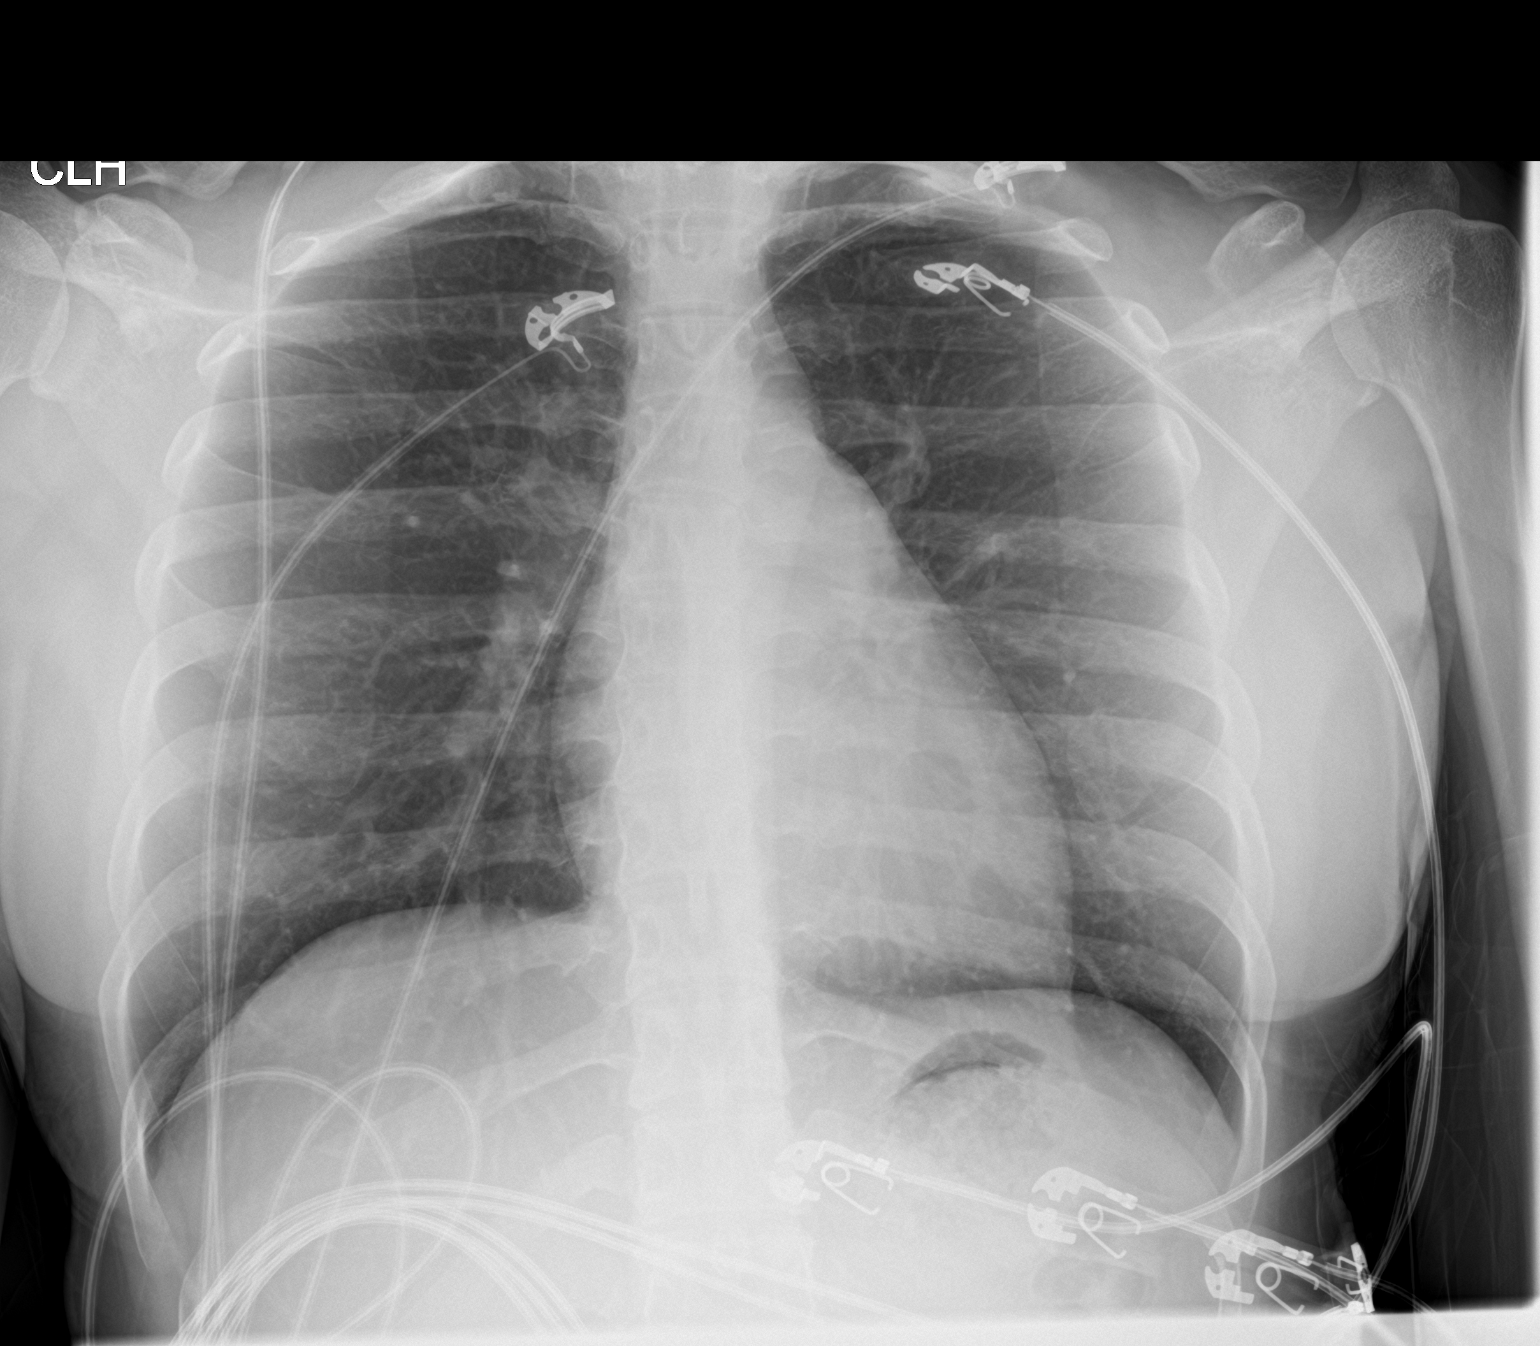

[2 of 2 positions shown; findings below may reference images not displayed]

FINDINGS: The heart size and mediastinal contours are within normal limits.
Both lungs are clear. The visualized skeletal structures are
unremarkable.
IMPRESSION: No active cardiopulmonary disease.

## 2019-05-25 ENCOUNTER — Other Ambulatory Visit: Payer: Self-pay | Admitting: Primary Care

## 2019-05-25 DIAGNOSIS — Z0000 Encounter for general adult medical examination without abnormal findings: Secondary | ICD-10-CM

## 2019-05-25 DIAGNOSIS — R7989 Other specified abnormal findings of blood chemistry: Secondary | ICD-10-CM

## 2019-06-17 ENCOUNTER — Telehealth: Payer: Self-pay

## 2019-06-17 NOTE — Telephone Encounter (Signed)
Left detailed VM w COVID screen and back door lab info and front door info   

## 2019-06-18 ENCOUNTER — Other Ambulatory Visit (INDEPENDENT_AMBULATORY_CARE_PROVIDER_SITE_OTHER): Payer: 59

## 2019-06-18 DIAGNOSIS — Z0000 Encounter for general adult medical examination without abnormal findings: Secondary | ICD-10-CM | POA: Diagnosis not present

## 2019-06-18 DIAGNOSIS — R945 Abnormal results of liver function studies: Secondary | ICD-10-CM

## 2019-06-18 DIAGNOSIS — R7989 Other specified abnormal findings of blood chemistry: Secondary | ICD-10-CM

## 2019-06-18 LAB — CBC
HCT: 39.8 % (ref 36.0–46.0)
Hemoglobin: 13.3 g/dL (ref 12.0–15.0)
MCHC: 33.6 g/dL (ref 30.0–36.0)
MCV: 93.2 fl (ref 78.0–100.0)
Platelets: 274 10*3/uL (ref 150.0–400.0)
RBC: 4.27 Mil/uL (ref 3.87–5.11)
RDW: 11.8 % (ref 11.5–15.5)
WBC: 7.8 10*3/uL (ref 4.0–10.5)

## 2019-06-18 LAB — COMPREHENSIVE METABOLIC PANEL
ALT: 14 U/L (ref 0–35)
AST: 16 U/L (ref 0–37)
Albumin: 4.5 g/dL (ref 3.5–5.2)
Alkaline Phosphatase: 58 U/L (ref 39–117)
BUN: 10 mg/dL (ref 6–23)
CO2: 26 mEq/L (ref 19–32)
Calcium: 9.1 mg/dL (ref 8.4–10.5)
Chloride: 104 mEq/L (ref 96–112)
Creatinine, Ser: 0.56 mg/dL (ref 0.40–1.20)
GFR: 123.73 mL/min (ref >60.00)
Glucose, Bld: 88 mg/dL (ref 70–99)
Potassium: 3.7 mEq/L (ref 3.5–5.1)
Sodium: 137 mEq/L (ref 135–145)
Total Bilirubin: 0.9 mg/dL (ref 0.2–1.2)
Total Protein: 7 g/dL (ref 6.0–8.3)

## 2019-06-18 LAB — LIPID PANEL
Cholesterol: 157 mg/dL (ref 0–200)
HDL: 47.8 mg/dL (ref >39.00)
LDL Cholesterol: 94 mg/dL (ref 0–99)
NonHDL: 109.21
Total CHOL/HDL Ratio: 3
Triglycerides: 76 mg/dL (ref 0.0–149.0)
VLDL: 15.2 mg/dL (ref 0.0–40.0)

## 2019-06-24 ENCOUNTER — Encounter: Payer: Self-pay | Admitting: Primary Care

## 2019-06-24 ENCOUNTER — Other Ambulatory Visit: Payer: Self-pay

## 2019-06-24 ENCOUNTER — Ambulatory Visit (INDEPENDENT_AMBULATORY_CARE_PROVIDER_SITE_OTHER): Payer: 59 | Admitting: Primary Care

## 2019-06-24 VITALS — BP 104/62 | HR 80 | Temp 98.6°F | Ht 59.0 in | Wt 123.0 lb

## 2019-06-24 DIAGNOSIS — M542 Cervicalgia: Secondary | ICD-10-CM | POA: Diagnosis not present

## 2019-06-24 DIAGNOSIS — Z0000 Encounter for general adult medical examination without abnormal findings: Secondary | ICD-10-CM

## 2019-06-24 DIAGNOSIS — R7989 Other specified abnormal findings of blood chemistry: Secondary | ICD-10-CM

## 2019-06-24 DIAGNOSIS — R945 Abnormal results of liver function studies: Secondary | ICD-10-CM

## 2019-06-24 HISTORY — DX: Cervicalgia: M54.2

## 2019-06-24 MED ORDER — CYCLOBENZAPRINE HCL 5 MG PO TABS: 5.0000 mg | ORAL_TABLET | Freq: Every evening | ORAL | 0 refills | Status: DC | PRN

## 2019-06-24 NOTE — Assessment & Plan Note (Signed)
Resolved on recent labs. Continue to monitor. 

## 2019-06-24 NOTE — Assessment & Plan Note (Signed)
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 Exam today with obvious muscle spasm.  Low dose muscle relaxer provided as NSAID's have not helped. Discussed to take HS, drowsiness precautions provided.   She will update.

## 2019-06-24 NOTE — Assessment & Plan Note (Signed)
Tetanus UTD, will get influenza vaccination at work. Pap smear due, scheduled with GYN. Discussed to work on a healthy diet, start exercising.  Exam today as noted. Labs reviewed. Follow up in 1 year for CPE.

## 2019-06-24 NOTE — Progress Notes (Signed)
Subjective:    Patient ID: Dawn Rivers, female    DOB: [birth year], 33 y.o.   MRN: [id number]  HPI  Dawn Rivers is a 33 year old female who presents today for complete physical.  Immunizations: -Tetanus: Completed in 2013 -Influenza: Due this season   Diet: She endorses a healthy diet. She eats vegetables, lean protein, starch, fruit. Daily desserts. Drinking water mostly, some juice.  Exercise: She is not exercising, some walking  Eye exam: Completed in 2019 Dental exam: Completes semi-annually  Pap Smear: Due this year, will see GYN in October 2020  BP Readings from Last 3 Encounters:  06/24/19 104/62  07/20/17 106/62  04/27/17 106/72     Review of Systems  Constitutional: Negative for unexpected weight change.  HENT: Negative for rhinorrhea.   Respiratory: Negative for cough and shortness of breath.   Cardiovascular: Negative for chest pain.  Gastrointestinal: Negative for constipation and diarrhea.  Genitourinary: Negative for difficulty urinating.  Musculoskeletal:       Acute neck pain for the last one week with muscle tightness to posterior lower neck. 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 Denies radiculopathy.  Skin: Negative for rash.  Allergic/Immunologic: Negative for environmental allergies.  Neurological: Negative for dizziness, numbness and headaches.  Psychiatric/Behavioral: The patient is not nervous/anxious.        Past Medical History:  Diagnosis Date  . Chicken pox   . Positive TB test   . Urinary tract infection      Social History   Socioeconomic History  . Marital status: Married    Spouse name: Not on file  . Number of children: Not on file  . Years of education: Not on file  . Highest education level: Not on file  Occupational History  . Not on file  Social Needs  . Financial resource strain: Not on file  . Food insecurity    Worry: Not on file    Inability: Not on file  . Transportation needs    Medical: Not on file     Non-medical: Not on file  Tobacco Use  . Smoking status: Current Some Day Smoker    Types: Cigarettes  . Smokeless tobacco: Never Used  . Tobacco comment: 2 to 3 in a few days  Substance and Sexual Activity  . Alcohol use: No    Alcohol/week: 0.0 standard drinks  . Drug use: Not on file  . Sexual activity: Not on file  Lifestyle  . Physical activity    Days per week: Not on file    Minutes per session: Not on file  . Stress: Not on file  Relationships  . Social Herbalist on phone: Not on file    Gets together: Not on file    Attends religious service: Not on file    Active member of club or organization: Not on file    Attends meetings of clubs or organizations: Not on file    Relationship status: Not on file  . Intimate partner violence    Fear of current or ex partner: Not on file    Emotionally abused: Not on file    Physically abused: Not on file    Forced sexual activity: Not on file  Other Topics Concern  . Not on file  Social History Narrative   Married.   1 child.   Works as Therapist, sports on the Runner, broadcasting/film/video.   Spending time with her family, running, reading.     Past Surgical  History:  Procedure Laterality Date  . CESAREAN SECTION      Family History  Problem Relation Age of Onset  . Hypertension Mother   . Hypertension Father   . Diabetes Father   . Hyperlipidemia Father   . Sleep apnea Father   . GER disease Father   . Hypertension Brother     No Known Allergies  Current Outpatient Medications on File Prior to Visit  Medication Sig Dispense Refill  . levonorgestrel (MIRENA, 52 MG,) 20 MCG/24HR IUD Inject 20 mcg as directed once.     No current facility-administered medications on file prior to visit.     BP 104/62   Pulse 80   Temp 98.6 F (37 C) (Temporal)   Ht 4\' 11"  (1.499 m)   Wt 123 lb (55.8 kg)   SpO2 98%   BMI 24.84 kg/m    Objective:   Physical Exam  Constitutional: She is oriented to person, place, and time. She  appears well-nourished.  HENT:  Right Ear: Tympanic membrane and ear canal normal.  Left Ear: Tympanic membrane and ear canal normal.  Mouth/Throat: Oropharynx is clear and moist.  Eyes: Pupils are equal, round, and reactive to light. EOM are normal.  Neck: Normal range of motion. Neck supple. Muscular tenderness present. No spinous process tenderness present.    Obvious spasm to posterior lower neck  Cardiovascular: Normal rate and regular rhythm.  Respiratory: Effort normal and breath sounds normal.  GI: Soft. Bowel sounds are normal. There is no abdominal tenderness.  Musculoskeletal: Normal range of motion.  Neurological: She is alert and oriented to person, place, and time.  Skin: Skin is warm and dry.  Psychiatric: She has a normal mood and affect.           Assessment & Plan:

## 2019-06-24 NOTE — Patient Instructions (Signed)
Start exercising. You should be getting 150 minutes of moderate intensity exercise weekly.  Continue to work on a healthy diet.  Ensure you are consuming 64 ounces of water daily.  You may take the cyclobenzaprine (Flexeril) muscle relaxer at bedtime as needed for muscle spasm.  Follow up with GYN as scheduled.  It was a pleasure to see you today!   Preventive Care 109-34 Years Old, Female Preventive care refers to visits with your health care provider and lifestyle choices that can promote health and wellness. This includes:  A yearly physical exam. This may also be called an annual well check.  Regular dental visits and eye exams.  Immunizations.  Screening for certain conditions.  Healthy lifestyle choices, such as eating a healthy diet, getting regular exercise, not using drugs or products that contain nicotine and tobacco, and limiting alcohol use. What can I expect for my preventive care visit? Physical exam Your health care provider will check your:  Height and weight. This may be used to calculate body mass index (BMI), which tells if you are at a healthy weight.  Heart rate and blood pressure.  Skin for abnormal spots. Counseling Your health care provider may ask you questions about your:  Alcohol, tobacco, and drug use.  Emotional well-being.  Home and relationship well-being.  Sexual activity.  Eating habits.  Work and work Statistician.  Method of birth control.  Menstrual cycle.  Pregnancy history. What immunizations do I need?  Influenza (flu) vaccine  This is recommended every year. Tetanus, diphtheria, and pertussis (Tdap) vaccine  You may need a Td booster every 10 years. Varicella (chickenpox) vaccine  You may need this if you have not been vaccinated. Human papillomavirus (HPV) vaccine  If recommended by your health care provider, you may need three doses over 6 months. Measles, mumps, and rubella (MMR) vaccine  You may need at  least one dose of MMR. You may also need a second dose. Meningococcal conjugate (MenACWY) vaccine  One dose is recommended if you are age 85-21 years and a first-year college student living in a residence hall, or if you have one of several medical conditions. You may also need additional booster doses. Pneumococcal conjugate (PCV13) vaccine  You may need this if you have certain conditions and were not previously vaccinated. Pneumococcal polysaccharide (PPSV23) vaccine  You may need one or two doses if you smoke cigarettes or if you have certain conditions. Hepatitis A vaccine  You may need this if you have certain conditions or if you travel or work in places where you may be exposed to hepatitis A. Hepatitis B vaccine  You may need this if you have certain conditions or if you travel or work in places where you may be exposed to hepatitis B. Haemophilus influenzae type b (Hib) vaccine  You may need this if you have certain conditions. You may receive vaccines as individual doses or as more than one vaccine together in one shot (combination vaccines). Talk with your health care provider about the risks and benefits of combination vaccines. What tests do I need?  Blood tests  Lipid and cholesterol levels. These may be checked every 5 years starting at age 60.  Hepatitis C test.  Hepatitis B test. Screening  Diabetes screening. This is done by checking your blood sugar (glucose) after you have not eaten for a while (fasting).  Sexually transmitted disease (STD) testing.  BRCA-related cancer screening. This may be done if you have a family history of breast,  ovarian, tubal, or peritoneal cancers.  Pelvic exam and Pap test. This may be done every 3 years starting at age 30. Starting at age 30, this may be done every 5 years if you have a Pap test in combination with an HPV test. Talk with your health care provider about your test results, treatment options, and if necessary, the  need for more tests. Follow these instructions at home: Eating and drinking   Eat a diet that includes fresh fruits and vegetables, whole grains, lean protein, and low-fat dairy.  Take vitamin and mineral supplements as recommended by your health care provider.  Do not drink alcohol if: ? Your health care provider tells you not to drink. ? You are pregnant, may be pregnant, or are planning to become pregnant.  If you drink alcohol: ? Limit how much you have to 0-1 drink a day. ? Be aware of how much alcohol is in your drink. In the [place] one drink equals one 12 oz bottle of beer (355 mL), one 5 oz glass of wine (148 mL), or one 1 oz glass of hard liquor (44 mL). Lifestyle  Take daily care of your teeth and gums.  Stay active. Exercise for at least 30 minutes on 5 or more days each week.  Do not use any products that contain nicotine or tobacco, such as cigarettes, e-cigarettes, and chewing tobacco. If you need help quitting, ask your health care provider.  If you are sexually active, practice safe sex. Use a condom or other form of birth control (contraception) in order to prevent pregnancy and STIs (sexually transmitted infections). If you plan to become pregnant, see your health care provider for a preconception visit. What's next?  Visit your health care provider once a year for a well check visit.  Ask your health care provider how often you should have your eyes and teeth checked.  Stay up to date on all vaccines. This information is not intended to replace advice given to you by your health care provider. Make sure you discuss any questions you have with your health care provider. Document Released: 11/21/2001 Document Revised: 06/06/2018 Document Reviewed: 06/06/2018 Elsevier Patient Education  2020 [employer].

## 2019-07-21 DIAGNOSIS — Z01419 Encounter for gynecological examination (general) (routine) without abnormal findings: Secondary | ICD-10-CM | POA: Diagnosis not present

## 2019-07-21 DIAGNOSIS — Z30432 Encounter for removal of intrauterine contraceptive device: Secondary | ICD-10-CM | POA: Diagnosis not present

## 2019-07-21 DIAGNOSIS — Z6824 Body mass index (BMI) 24.0-24.9, adult: Secondary | ICD-10-CM | POA: Diagnosis not present

## 2019-07-21 DIAGNOSIS — Z1389 Encounter for screening for other disorder: Secondary | ICD-10-CM | POA: Diagnosis not present

## 2020-02-12 DIAGNOSIS — Z20828 Contact with and (suspected) exposure to other viral communicable diseases: Secondary | ICD-10-CM | POA: Diagnosis not present

## 2020-06-08 ENCOUNTER — Other Ambulatory Visit: Payer: Self-pay | Admitting: Primary Care

## 2020-06-08 DIAGNOSIS — Z114 Encounter for screening for human immunodeficiency virus [HIV]: Secondary | ICD-10-CM

## 2020-06-08 DIAGNOSIS — Z0000 Encounter for general adult medical examination without abnormal findings: Secondary | ICD-10-CM

## 2020-06-08 DIAGNOSIS — Z1159 Encounter for screening for other viral diseases: Secondary | ICD-10-CM

## 2020-06-18 ENCOUNTER — Other Ambulatory Visit (INDEPENDENT_AMBULATORY_CARE_PROVIDER_SITE_OTHER): Payer: 59

## 2020-06-18 ENCOUNTER — Other Ambulatory Visit: Payer: Self-pay

## 2020-06-18 DIAGNOSIS — Z0000 Encounter for general adult medical examination without abnormal findings: Secondary | ICD-10-CM

## 2020-06-18 DIAGNOSIS — Z114 Encounter for screening for human immunodeficiency virus [HIV]: Secondary | ICD-10-CM

## 2020-06-18 DIAGNOSIS — Z1159 Encounter for screening for other viral diseases: Secondary | ICD-10-CM

## 2020-06-18 LAB — COMPREHENSIVE METABOLIC PANEL
ALT: 15 U/L (ref 0–35)
AST: 15 U/L (ref 0–37)
Albumin: 4.8 g/dL (ref 3.5–5.2)
Alkaline Phosphatase: 64 U/L (ref 39–117)
BUN: 11 mg/dL (ref 6–23)
CO2: 27 mEq/L (ref 19–32)
Calcium: 8.8 mg/dL (ref 8.4–10.5)
Chloride: 102 mEq/L (ref 96–112)
Creatinine, Ser: 0.62 mg/dL (ref 0.40–1.20)
GFR: 109.38 mL/min (ref >60.00)
Glucose, Bld: 90 mg/dL (ref 70–99)
Potassium: 3.5 mEq/L (ref 3.5–5.1)
Sodium: 136 mEq/L (ref 135–145)
Total Bilirubin: 1.2 mg/dL (ref 0.2–1.2)
Total Protein: 7.3 g/dL (ref 6.0–8.3)

## 2020-06-18 LAB — LIPID PANEL
Cholesterol: 164 mg/dL (ref 0–200)
HDL: 57.4 mg/dL (ref >39.00)
LDL Cholesterol: 96 mg/dL (ref 0–99)
NonHDL: 106.64
Total CHOL/HDL Ratio: 3
Triglycerides: 55 mg/dL (ref 0.0–149.0)
VLDL: 11 mg/dL (ref 0.0–40.0)

## 2020-06-21 LAB — HEPATITIS C ANTIBODY
Hepatitis C Ab: NONREACTIVE
SIGNAL TO CUT-OFF: 0.02 (ref <1.00)

## 2020-06-21 LAB — HIV ANTIBODY (ROUTINE TESTING W REFLEX): HIV 1&2 Ab, 4th Generation: NONREACTIVE

## 2020-06-24 ENCOUNTER — Encounter: Payer: Self-pay | Admitting: Primary Care

## 2020-06-24 ENCOUNTER — Other Ambulatory Visit: Payer: Self-pay

## 2020-06-24 ENCOUNTER — Ambulatory Visit (INDEPENDENT_AMBULATORY_CARE_PROVIDER_SITE_OTHER): Payer: 59 | Admitting: Primary Care

## 2020-06-24 VITALS — BP 118/62 | HR 78 | Temp 98.6°F | Ht 60.0 in | Wt 124.0 lb

## 2020-06-24 DIAGNOSIS — Z0000 Encounter for general adult medical examination without abnormal findings: Secondary | ICD-10-CM

## 2020-06-24 NOTE — Progress Notes (Signed)
Subjective:    Patient ID: Dawn Rivers, female    DOB: Aug 18, 1986, 34 y.o.   MRN: [id number]  HPI  This visit occurred during the SARS-CoV-2 public health emergency.  Safety protocols were in place, including screening questions prior to the visit, additional usage of staff PPE, and extensive cleaning of exam room while observing appropriate contact time as indicated for disinfecting solutions.   Ms. Dawn Rivers is a 34 year old female who presents today for complete physical.  Immunizations: -Tetanus: Completed in 2013 -Influenza: Due this season, will get at work -Covid-19: Completed series   Diet: She endorses a healthy diet.  Exercise: She is not exercising.   Eye exam: No recent exam Dental exam: Completes semi-annually    Pap Smear: UTD, follows with GYN.  Hep C Screen: Negative  BP Readings from Last 3 Encounters:  06/24/20 118/62  06/24/19 104/62  07/20/17 106/62     Review of Systems  Constitutional: Negative for unexpected weight change.  HENT: Negative for rhinorrhea.   Respiratory: Negative for cough and shortness of breath.   Cardiovascular: Negative for chest pain.  Gastrointestinal: Negative for constipation and diarrhea.  Genitourinary: Negative for difficulty urinating and menstrual problem.  Musculoskeletal: Negative for arthralgias and myalgias.  Skin: Negative for rash.  Allergic/Immunologic: Negative for environmental allergies.  Neurological: Negative for dizziness and headaches.  Psychiatric/Behavioral: The patient is not nervous/anxious.        Past Medical History:  Diagnosis Date  . Chicken pox   . Positive TB test   . Urinary tract infection      Social History   Socioeconomic History  . Marital status: Married    Spouse name: Not on file  . Number of children: Not on file  . Years of education: Not on file  . Highest education level: Not on file  Occupational History  . Not on file  Tobacco Use  . Smoking status: Current  Some Day Smoker    Types: Cigarettes  . Smokeless tobacco: Never Used  . Tobacco comment: 2 to 3 in a few days  Substance and Sexual Activity  . Alcohol use: No    Alcohol/week: 0.0 standard drinks  . Drug use: Not on file  . Sexual activity: Not on file  Other Topics Concern  . Not on file  Social History Narrative   Married.   1 child.   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   Spending time with her family, running, reading.    Social Determinants of Health   Financial Resource Strain:   . Difficulty of Paying Living Expenses: Not on file  Food Insecurity:   . Worried About Programme researcher, broadcasting/film/video in the Last Year: Not on file  . Ran Out of Food in the Last Year: Not on file  Transportation Needs:   . Lack of Transportation (Medical): Not on file  . Lack of Transportation (Non-Medical): Not on file  Physical Activity:   . Days of Exercise per Week: Not on file  . Minutes of Exercise per Session: Not on file  Stress:   . Feeling of Stress : Not on file  Social Connections:   . Frequency of Communication with Friends and Family: Not on file  . Frequency of Social Gatherings with Friends and Family: Not on file  . Attends Religious Services: Not on file  . Active Member of Clubs or Organizations: Not on file  . Attends Banker Meetings: Not on file  .  Marital Status: Not on file  Intimate Partner Violence:   . Fear of Current or Ex-Partner: Not on file  . Emotionally Abused: Not on file  . Physically Abused: Not on file  . Sexually Abused: Not on file    Past Surgical History:  Procedure Laterality Date  . CESAREAN SECTION      Family History  Problem Relation Age of Onset  . Hypertension Mother   . Hypertension Father   . Diabetes Father   . Hyperlipidemia Father   . Sleep apnea Father   . GER disease Father   . Hypertension Brother     No Known Allergies  Current Outpatient Medications on File Prior to Visit  Medication Sig Dispense Refill  .  diphenhydrAMINE (BENADRYL) 25 mg capsule Take 25 mg by mouth every 6 (six) hours as needed.    . Melatonin 5 MG CAPS Take by mouth.    . Prenatal Vit-Fe Fumarate-FA (PRENATAL MULTIVITAMIN) TABS tablet Take 1 tablet by mouth daily at 12 noon.     No current facility-administered medications on file prior to visit.    BP 118/62   Pulse 78   Temp 98.6 F (37 C) (Temporal)   Ht 5' (1.524 m)   Wt 124 lb (56.2 kg)   SpO2 98%   BMI 24.22 kg/m    Objective:   Physical Exam HENT:     Right Ear: Tympanic membrane and ear canal normal.     Left Ear: Tympanic membrane and ear canal normal.  Eyes:     Pupils: Pupils are equal, round, and reactive to light.  Cardiovascular:     Rate and Rhythm: Normal rate and regular rhythm.  Pulmonary:     Effort: Pulmonary effort is normal.     Breath sounds: Normal breath sounds.  Abdominal:     General: Bowel sounds are normal.     Palpations: Abdomen is soft.     Tenderness: There is no abdominal tenderness.  Musculoskeletal:        General: Normal range of motion.     Cervical back: Neck supple.  Skin:    General: Skin is warm and dry.  Neurological:     Mental Status: She is alert and oriented to person, place, and time.     Cranial Nerves: No cranial nerve deficit.     Deep Tendon Reflexes:     Reflex Scores:      Patellar reflexes are 2+ on the right side and 2+ on the left side. Psychiatric:        Mood and Affect: Mood normal.            Assessment & Plan:

## 2020-06-24 NOTE — Patient Instructions (Addendum)
Please send our office a my chart message when your receive your flu shot at work.  Start exercising. You should be getting 150 minutes of moderate intensity exercise weekly.  Continue to work on a healthy diet. Ensure you are consuming 64 ounces of water daily.  It was a pleasure to see you today!   Preventive Care 69-35 Years Old, Female Preventive care refers to visits with your health care provider and lifestyle choices that can promote health and wellness. This includes:  A yearly physical exam. This may also be called an annual well check.  Regular dental visits and eye exams.  Immunizations.  Screening for certain conditions.  Healthy lifestyle choices, such as eating a healthy diet, getting regular exercise, not using drugs or products that contain nicotine and tobacco, and limiting alcohol use. What can I expect for my preventive care visit? Physical exam Your health care provider will check your:  Height and weight. This may be used to calculate body mass index (BMI), which tells if you are at a healthy weight.  Heart rate and blood pressure.  Skin for abnormal spots. Counseling Your health care provider may ask you questions about your:  Alcohol, tobacco, and drug use.  Emotional well-being.  Home and relationship well-being.  Sexual activity.  Eating habits.  Work and work Statistician.  Method of birth control.  Menstrual cycle.  Pregnancy history. What immunizations do I need?  Influenza (flu) vaccine  This is recommended every year. Tetanus, diphtheria, and pertussis (Tdap) vaccine  You may need a Td booster every 10 years. Varicella (chickenpox) vaccine  You may need this if you have not been vaccinated. Human papillomavirus (HPV) vaccine  If recommended by your health care provider, you may need three doses over 6 months. Measles, mumps, and rubella (MMR) vaccine  You may need at least one dose of MMR. You may also need a second  dose. Meningococcal conjugate (MenACWY) vaccine  One dose is recommended if you are age 48-21 years and a first-year college student living in a residence hall, or if you have one of several medical conditions. You may also need additional booster doses. Pneumococcal conjugate (PCV13) vaccine  You may need this if you have certain conditions and were not previously vaccinated. Pneumococcal polysaccharide (PPSV23) vaccine  You may need one or two doses if you smoke cigarettes or if you have certain conditions. Hepatitis A vaccine  You may need this if you have certain conditions or if you travel or work in places where you may be exposed to hepatitis A. Hepatitis B vaccine  You may need this if you have certain conditions or if you travel or work in places where you may be exposed to hepatitis B. Haemophilus influenzae type b (Hib) vaccine  You may need this if you have certain conditions. You may receive vaccines as individual doses or as more than one vaccine together in one shot (combination vaccines). Talk with your health care provider about the risks and benefits of combination vaccines. What tests do I need?  Blood tests  Lipid and cholesterol levels. These may be checked every 5 years starting at age 35.  Hepatitis C test.  Hepatitis B test. Screening  Diabetes screening. This is done by checking your blood sugar (glucose) after you have not eaten for a while (fasting).  Sexually transmitted disease (STD) testing.  BRCA-related cancer screening. This may be done if you have a family history of breast, ovarian, tubal, or peritoneal cancers.  Pelvic  exam and Pap test. This may be done every 3 years starting at age 35. Starting at age 35, this may be done every 5 years if you have a Pap test in combination with an HPV test. Talk with your health care provider about your test results, treatment options, and if necessary, the need for more tests. Follow these instructions at  home: Eating and drinking   Eat a diet that includes fresh fruits and vegetables, whole grains, lean protein, and low-fat dairy.  Take vitamin and mineral supplements as recommended by your health care provider.  Do not drink alcohol if: ? Your health care provider tells you not to drink. ? You are pregnant, may be pregnant, or are planning to become pregnant.  If you drink alcohol: ? Limit how much you have to 0-1 drink a day. ? Be aware of how much alcohol is in your drink. In the [place] one drink equals one 12 oz bottle of beer (355 mL), one 5 oz glass of wine (148 mL), or one 1 oz glass of hard liquor (44 mL). Lifestyle  Take daily care of your teeth and gums.  Stay active. Exercise for at least 30 minutes on 5 or more days each week.  Do not use any products that contain nicotine or tobacco, such as cigarettes, e-cigarettes, and chewing tobacco. If you need help quitting, ask your health care provider.  If you are sexually active, practice safe sex. Use a condom or other form of birth control (contraception) in order to prevent pregnancy and STIs (sexually transmitted infections). If you plan to become pregnant, see your health care provider for a preconception visit. What's next?  Visit your health care provider once a year for a well check visit.  Ask your health care provider how often you should have your eyes and teeth checked.  Stay up to date on all vaccines. This information is not intended to replace advice given to you by your health care provider. Make sure you discuss any questions you have with your health care provider. Document Revised: 06/06/2018 Document Reviewed: 06/06/2018 Elsevier Patient Education  2020 [employer].

## 2020-06-24 NOTE — Assessment & Plan Note (Signed)
Will get influenza vaccine at work. Pap smear UTD per patient, will obtain from GYN. Recommended to start exercising, continue to work on a healthy diet.  Exam today unremarkable. Labs reviewed.

## 2020-07-22 DIAGNOSIS — Z1389 Encounter for screening for other disorder: Secondary | ICD-10-CM | POA: Diagnosis not present

## 2020-07-22 DIAGNOSIS — Z6823 Body mass index (BMI) 23.0-23.9, adult: Secondary | ICD-10-CM | POA: Diagnosis not present

## 2020-07-22 DIAGNOSIS — Z130 Encounter for screening for diseases of the blood and blood-forming organs and certain disorders involving the immune mechanism: Secondary | ICD-10-CM | POA: Diagnosis not present

## 2020-07-22 DIAGNOSIS — Z124 Encounter for screening for malignant neoplasm of cervix: Secondary | ICD-10-CM | POA: Diagnosis not present

## 2020-07-22 DIAGNOSIS — Z3202 Encounter for pregnancy test, result negative: Secondary | ICD-10-CM | POA: Diagnosis not present

## 2020-07-22 DIAGNOSIS — Z1151 Encounter for screening for human papillomavirus (HPV): Secondary | ICD-10-CM | POA: Diagnosis not present

## 2020-07-22 DIAGNOSIS — R8290 Unspecified abnormal findings in urine: Secondary | ICD-10-CM | POA: Diagnosis not present

## 2020-07-22 DIAGNOSIS — Z01419 Encounter for gynecological examination (general) (routine) without abnormal findings: Secondary | ICD-10-CM | POA: Diagnosis not present

## 2020-07-22 LAB — RESULTS CONSOLE HPV: CHL HPV: NEGATIVE

## 2020-07-22 LAB — HM PAP SMEAR

## 2021-06-30 DIAGNOSIS — H52223 Regular astigmatism, bilateral: Secondary | ICD-10-CM | POA: Diagnosis not present

## 2021-07-20 DIAGNOSIS — Z3201 Encounter for pregnancy test, result positive: Secondary | ICD-10-CM | POA: Diagnosis not present

## 2021-07-20 DIAGNOSIS — N911 Secondary amenorrhea: Secondary | ICD-10-CM | POA: Diagnosis not present

## 2021-08-09 DIAGNOSIS — O26891 Other specified pregnancy related conditions, first trimester: Secondary | ICD-10-CM | POA: Diagnosis not present

## 2021-08-09 DIAGNOSIS — O09519 Supervision of elderly primigravida, unspecified trimester: Secondary | ICD-10-CM | POA: Diagnosis not present

## 2021-08-09 DIAGNOSIS — Z369 Encounter for antenatal screening, unspecified: Secondary | ICD-10-CM | POA: Diagnosis not present

## 2021-08-09 DIAGNOSIS — O09521 Supervision of elderly multigravida, first trimester: Secondary | ICD-10-CM | POA: Diagnosis not present

## 2021-08-09 DIAGNOSIS — Z113 Encounter for screening for infections with a predominantly sexual mode of transmission: Secondary | ICD-10-CM | POA: Diagnosis not present

## 2021-08-09 DIAGNOSIS — Z3A01 Less than 8 weeks gestation of pregnancy: Secondary | ICD-10-CM | POA: Diagnosis not present

## 2021-08-09 LAB — OB RESULTS CONSOLE GC/CHLAMYDIA
Chlamydia: NEGATIVE
Neisseria Gonorrhea: NEGATIVE

## 2021-08-09 LAB — OB RESULTS CONSOLE ANTIBODY SCREEN: Antibody Screen: NEGATIVE

## 2021-08-09 LAB — HEPATITIS C ANTIBODY: HCV Ab: NEGATIVE

## 2021-08-09 LAB — OB RESULTS CONSOLE HIV ANTIBODY (ROUTINE TESTING): HIV: NONREACTIVE

## 2021-08-09 LAB — OB RESULTS CONSOLE RUBELLA ANTIBODY, IGM: Rubella: IMMUNE

## 2021-08-09 LAB — OB RESULTS CONSOLE ABO/RH: RH Type: POSITIVE

## 2021-08-09 LAB — OB RESULTS CONSOLE RPR: RPR: NONREACTIVE

## 2021-08-09 LAB — OB RESULTS CONSOLE HEPATITIS B SURFACE ANTIGEN: Hepatitis B Surface Ag: NEGATIVE

## 2021-09-09 DIAGNOSIS — Z1389 Encounter for screening for other disorder: Secondary | ICD-10-CM | POA: Diagnosis not present

## 2021-09-09 DIAGNOSIS — Z3A12 12 weeks gestation of pregnancy: Secondary | ICD-10-CM | POA: Diagnosis not present

## 2021-09-09 DIAGNOSIS — O09521 Supervision of elderly multigravida, first trimester: Secondary | ICD-10-CM | POA: Diagnosis not present

## 2021-10-05 DIAGNOSIS — R82998 Other abnormal findings in urine: Secondary | ICD-10-CM | POA: Diagnosis not present

## 2021-10-28 DIAGNOSIS — Z363 Encounter for antenatal screening for malformations: Secondary | ICD-10-CM | POA: Diagnosis not present

## 2021-10-28 DIAGNOSIS — O09522 Supervision of elderly multigravida, second trimester: Secondary | ICD-10-CM | POA: Diagnosis not present

## 2021-10-28 DIAGNOSIS — R8290 Unspecified abnormal findings in urine: Secondary | ICD-10-CM | POA: Diagnosis not present

## 2021-10-28 DIAGNOSIS — Z3A19 19 weeks gestation of pregnancy: Secondary | ICD-10-CM | POA: Diagnosis not present

## 2021-11-21 DIAGNOSIS — Z3A22 22 weeks gestation of pregnancy: Secondary | ICD-10-CM | POA: Diagnosis not present

## 2021-11-21 DIAGNOSIS — R8290 Unspecified abnormal findings in urine: Secondary | ICD-10-CM | POA: Diagnosis not present

## 2021-11-21 DIAGNOSIS — O09522 Supervision of elderly multigravida, second trimester: Secondary | ICD-10-CM | POA: Diagnosis not present

## 2021-11-21 DIAGNOSIS — O09519 Supervision of elderly primigravida, unspecified trimester: Secondary | ICD-10-CM | POA: Diagnosis not present

## 2021-11-21 DIAGNOSIS — Z362 Encounter for other antenatal screening follow-up: Secondary | ICD-10-CM | POA: Diagnosis not present

## 2021-11-22 DIAGNOSIS — R8290 Unspecified abnormal findings in urine: Secondary | ICD-10-CM | POA: Diagnosis not present

## 2021-11-29 DIAGNOSIS — N390 Urinary tract infection, site not specified: Secondary | ICD-10-CM | POA: Diagnosis not present

## 2021-12-02 ENCOUNTER — Encounter: Payer: Self-pay | Admitting: Primary Care

## 2021-12-07 ENCOUNTER — Encounter: Payer: 59 | Admitting: Primary Care

## 2021-12-22 DIAGNOSIS — Z3689 Encounter for other specified antenatal screening: Secondary | ICD-10-CM | POA: Diagnosis not present

## 2021-12-22 DIAGNOSIS — O34211 Maternal care for low transverse scar from previous cesarean delivery: Secondary | ICD-10-CM | POA: Diagnosis not present

## 2021-12-22 DIAGNOSIS — O4422 Partial placenta previa NOS or without hemorrhage, second trimester: Secondary | ICD-10-CM | POA: Diagnosis not present

## 2021-12-22 DIAGNOSIS — O09522 Supervision of elderly multigravida, second trimester: Secondary | ICD-10-CM | POA: Diagnosis not present

## 2021-12-22 DIAGNOSIS — Z23 Encounter for immunization: Secondary | ICD-10-CM | POA: Diagnosis not present

## 2021-12-22 DIAGNOSIS — Z3A27 27 weeks gestation of pregnancy: Secondary | ICD-10-CM | POA: Diagnosis not present

## 2021-12-29 DIAGNOSIS — O99810 Abnormal glucose complicating pregnancy: Secondary | ICD-10-CM | POA: Diagnosis not present

## 2022-01-03 DIAGNOSIS — R82998 Other abnormal findings in urine: Secondary | ICD-10-CM | POA: Diagnosis not present

## 2022-01-20 DIAGNOSIS — O99810 Abnormal glucose complicating pregnancy: Secondary | ICD-10-CM | POA: Diagnosis not present

## 2022-01-24 ENCOUNTER — Encounter: Payer: 59 | Admitting: Primary Care

## 2022-01-26 DIAGNOSIS — Z3A32 32 weeks gestation of pregnancy: Secondary | ICD-10-CM | POA: Diagnosis not present

## 2022-01-26 DIAGNOSIS — Z87440 Personal history of urinary (tract) infections: Secondary | ICD-10-CM | POA: Diagnosis not present

## 2022-01-26 DIAGNOSIS — O34211 Maternal care for low transverse scar from previous cesarean delivery: Secondary | ICD-10-CM | POA: Diagnosis not present

## 2022-01-26 DIAGNOSIS — O3433 Maternal care for cervical incompetence, third trimester: Secondary | ICD-10-CM | POA: Diagnosis not present

## 2022-01-26 DIAGNOSIS — O09523 Supervision of elderly multigravida, third trimester: Secondary | ICD-10-CM | POA: Diagnosis not present

## 2022-02-13 DIAGNOSIS — N390 Urinary tract infection, site not specified: Secondary | ICD-10-CM | POA: Diagnosis not present

## 2022-02-23 DIAGNOSIS — O24415 Gestational diabetes mellitus in pregnancy, controlled by oral hypoglycemic drugs: Secondary | ICD-10-CM | POA: Diagnosis not present

## 2022-02-23 DIAGNOSIS — Z3685 Encounter for antenatal screening for Streptococcus B: Secondary | ICD-10-CM | POA: Diagnosis not present

## 2022-02-23 DIAGNOSIS — O34211 Maternal care for low transverse scar from previous cesarean delivery: Secondary | ICD-10-CM | POA: Diagnosis not present

## 2022-02-23 DIAGNOSIS — O09523 Supervision of elderly multigravida, third trimester: Secondary | ICD-10-CM | POA: Diagnosis not present

## 2022-02-23 DIAGNOSIS — Z3A36 36 weeks gestation of pregnancy: Secondary | ICD-10-CM | POA: Diagnosis not present

## 2022-02-23 LAB — OB RESULTS CONSOLE GBS: GBS: NEGATIVE

## 2022-02-27 DIAGNOSIS — O09523 Supervision of elderly multigravida, third trimester: Secondary | ICD-10-CM | POA: Diagnosis not present

## 2022-02-27 DIAGNOSIS — O24419 Gestational diabetes mellitus in pregnancy, unspecified control: Secondary | ICD-10-CM | POA: Diagnosis not present

## 2022-02-27 DIAGNOSIS — Z3A36 36 weeks gestation of pregnancy: Secondary | ICD-10-CM | POA: Diagnosis not present

## 2022-03-01 ENCOUNTER — Telehealth (HOSPITAL_COMMUNITY): Payer: Self-pay | Admitting: *Deleted

## 2022-03-01 ENCOUNTER — Encounter (HOSPITAL_COMMUNITY): Payer: Self-pay | Admitting: *Deleted

## 2022-03-01 ENCOUNTER — Encounter (HOSPITAL_COMMUNITY): Payer: Self-pay

## 2022-03-01 NOTE — Patient Instructions (Signed)
Dawn Rivers  03/01/2022   Your procedure is scheduled on:  03/15/2022  Arrive at 0830 at [employer] [facility] on [address] at [facility]  and [employer]. You are invited to use the FREE valet parking or use the Visitor's parking deck.  Pick up the phone at the desk and dial [phone].  Call this number if you have problems the morning of surgery: [phone]  Remember:   Do not eat food:(After Midnight) Desps de medianoche.  Do not drink clear liquids: (After Midnight) Desps de medianoche.  Take these medicines the morning of surgery with A SIP OF WATER:  none   Do not wear jewelry, make-up or nail polish.  Do not wear lotions, powders, or perfumes. Do not wear deodorant.  Do not shave 48 hours prior to surgery.  Do not bring valuables to the hospital.  [facility] is not   responsible for any belongings or valuables brought to the hospital.  Contacts, dentures or bridgework may not be worn into surgery.  Leave suitcase in the car. After surgery it may be brought to your room.  For patients admitted to the hospital, checkout time is 11:00 AM the day of              discharge.      Please read over the following fact sheets that you were given:     Preparing for Surgery

## 2022-03-01 NOTE — Telephone Encounter (Signed)
Preadmission screen  

## 2022-03-02 ENCOUNTER — Encounter (HOSPITAL_COMMUNITY): Payer: Self-pay

## 2022-03-07 ENCOUNTER — Other Ambulatory Visit (HOSPITAL_COMMUNITY): Payer: Self-pay

## 2022-03-08 ENCOUNTER — Other Ambulatory Visit (HOSPITAL_COMMUNITY): Payer: Self-pay

## 2022-03-08 DIAGNOSIS — Z3A38 38 weeks gestation of pregnancy: Secondary | ICD-10-CM | POA: Diagnosis not present

## 2022-03-08 DIAGNOSIS — O09523 Supervision of elderly multigravida, third trimester: Secondary | ICD-10-CM | POA: Diagnosis not present

## 2022-03-08 MED ORDER — METFORMIN HCL 500 MG PO TABS
1000.0000 mg | ORAL_TABLET | Freq: Two times a day (BID) | ORAL | 3 refills | Status: DC
  Filled 2022-03-08: qty 120, 30d supply, fill #0

## 2022-03-13 ENCOUNTER — Encounter (HOSPITAL_COMMUNITY)
Admission: RE | Admit: 2022-03-13 | Discharge: 2022-03-13 | Disposition: A | Discharge status: Home / Self Care | Payer: 59 | Source: Ambulatory Visit | Attending: Family Medicine | Admitting: Family Medicine

## 2022-03-13 DIAGNOSIS — Z302 Encounter for sterilization: Secondary | ICD-10-CM | POA: Diagnosis not present

## 2022-03-13 DIAGNOSIS — O34219 Maternal care for unspecified type scar from previous cesarean delivery: Secondary | ICD-10-CM | POA: Diagnosis not present

## 2022-03-13 DIAGNOSIS — O24425 Gestational diabetes mellitus in childbirth, controlled by oral hypoglycemic drugs: Secondary | ICD-10-CM | POA: Diagnosis not present

## 2022-03-13 DIAGNOSIS — F17210 Nicotine dependence, cigarettes, uncomplicated: Secondary | ICD-10-CM | POA: Diagnosis not present

## 2022-03-13 DIAGNOSIS — Z01812 Encounter for preprocedural laboratory examination: Secondary | ICD-10-CM | POA: Insufficient documentation

## 2022-03-13 DIAGNOSIS — Z98891 History of uterine scar from previous surgery: Secondary | ICD-10-CM | POA: Insufficient documentation

## 2022-03-13 DIAGNOSIS — O34211 Maternal care for low transverse scar from previous cesarean delivery: Secondary | ICD-10-CM | POA: Diagnosis not present

## 2022-03-13 DIAGNOSIS — O99334 Smoking (tobacco) complicating childbirth: Secondary | ICD-10-CM | POA: Diagnosis not present

## 2022-03-13 DIAGNOSIS — Z3A39 39 weeks gestation of pregnancy: Secondary | ICD-10-CM | POA: Diagnosis not present

## 2022-03-13 HISTORY — DX: Gestational diabetes mellitus in pregnancy, unspecified control: O24.419

## 2022-03-13 LAB — TYPE AND SCREEN
ABO/RH(D): O POS
Antibody Screen: NEGATIVE

## 2022-03-13 LAB — CBC
HCT: 41.1 % (ref 36.0–46.0)
Hemoglobin: 13.8 g/dL (ref 12.0–15.0)
MCH: 30.9 pg (ref 26.0–34.0)
MCHC: 33.6 g/dL (ref 30.0–36.0)
MCV: 92.2 fL (ref 80.0–100.0)
Platelets: 279 10*3/uL (ref 150–400)
RBC: 4.46 MIL/uL (ref 3.87–5.11)
RDW: 13.2 % (ref 11.5–15.5)
WBC: 10.9 10*3/uL — ABNORMAL HIGH (ref 4.0–10.5)
nRBC: 0 % (ref 0.0–0.2)

## 2022-03-13 LAB — RPR: RPR Ser Ql: NONREACTIVE

## 2022-03-15 ENCOUNTER — Other Ambulatory Visit: Payer: Self-pay

## 2022-03-15 ENCOUNTER — Inpatient Hospital Stay (HOSPITAL_COMMUNITY)
Admission: RE | Admit: 2022-03-15 | Discharge: 2022-03-17 | DRG: 785 | Disposition: A | Discharge status: Home / Self Care | Payer: 59 | Source: Ambulatory Visit | Attending: Obstetrics and Gynecology | Admitting: Obstetrics and Gynecology

## 2022-03-15 ENCOUNTER — Encounter (HOSPITAL_COMMUNITY)
Admission: RE | Disposition: A | Discharge status: Home / Self Care | Payer: Self-pay | Source: Ambulatory Visit | Attending: Obstetrics and Gynecology

## 2022-03-15 ENCOUNTER — Inpatient Hospital Stay (HOSPITAL_COMMUNITY): Payer: 59 | Admitting: Anesthesiology

## 2022-03-15 ENCOUNTER — Encounter (HOSPITAL_COMMUNITY): Payer: Self-pay | Admitting: Obstetrics and Gynecology

## 2022-03-15 DIAGNOSIS — Z3A39 39 weeks gestation of pregnancy: Secondary | ICD-10-CM

## 2022-03-15 DIAGNOSIS — Z302 Encounter for sterilization: Secondary | ICD-10-CM | POA: Diagnosis not present

## 2022-03-15 DIAGNOSIS — F17210 Nicotine dependence, cigarettes, uncomplicated: Secondary | ICD-10-CM | POA: Diagnosis present

## 2022-03-15 DIAGNOSIS — Z98891 History of uterine scar from previous surgery: Secondary | ICD-10-CM | POA: Diagnosis not present

## 2022-03-15 DIAGNOSIS — O24425 Gestational diabetes mellitus in childbirth, controlled by oral hypoglycemic drugs: Secondary | ICD-10-CM | POA: Diagnosis present

## 2022-03-15 DIAGNOSIS — O34211 Maternal care for low transverse scar from previous cesarean delivery: Secondary | ICD-10-CM

## 2022-03-15 DIAGNOSIS — O34219 Maternal care for unspecified type scar from previous cesarean delivery: Secondary | ICD-10-CM | POA: Diagnosis not present

## 2022-03-15 DIAGNOSIS — O99334 Smoking (tobacco) complicating childbirth: Secondary | ICD-10-CM | POA: Diagnosis present

## 2022-03-15 LAB — GLUCOSE, CAPILLARY
Glucose-Capillary: 79 mg/dL (ref 70–99)
Glucose-Capillary: 78 mg/dL (ref 70–99)

## 2022-03-15 LAB — ABO/RH: ABO/RH(D): O POS

## 2022-03-15 SURGERY — Surgical Case
Anesthesia: Spinal | Laterality: Bilateral

## 2022-03-15 MED ORDER — OXYTOCIN-SODIUM CHLORIDE 30-0.9 UT/500ML-% IV SOLN
2.5000 [IU]/h | INTRAVENOUS | Status: AC
  Administered 2022-03-15: 2.5 [IU]/h via INTRAVENOUS
  Filled 2022-03-15: qty 500

## 2022-03-15 MED ORDER — POVIDONE-IODINE 10 % EX SWAB
2.0000 "application " | Freq: Once | CUTANEOUS | Status: AC
  Administered 2022-03-15: 2 via TOPICAL

## 2022-03-15 MED ORDER — KETOROLAC TROMETHAMINE 30 MG/ML IJ SOLN
INTRAMUSCULAR | Status: AC
  Filled 2022-03-15: qty 1

## 2022-03-15 MED ORDER — FENTANYL CITRATE (PF) 100 MCG/2ML IJ SOLN
INTRAMUSCULAR | Status: AC
  Filled 2022-03-15: qty 2

## 2022-03-15 MED ORDER — DIBUCAINE (PERIANAL) 1 % EX OINT
1.0000 | TOPICAL_OINTMENT | CUTANEOUS | Status: DC | PRN
Start: 2022-03-15 — End: 2022-03-17

## 2022-03-15 MED ORDER — LACTATED RINGERS IV SOLN: INTRAVENOUS | Status: DC

## 2022-03-15 MED ORDER — PROMETHAZINE HCL 25 MG/ML IJ SOLN: 6.2500 mg | INTRAMUSCULAR | Status: DC | PRN

## 2022-03-15 MED ORDER — COCONUT OIL OIL: 1.0000 "application " | TOPICAL_OIL | Status: DC | PRN

## 2022-03-15 MED ORDER — FENTANYL CITRATE (PF) 100 MCG/2ML IJ SOLN
INTRAMUSCULAR | Status: DC | PRN
  Administered 2022-03-15: 85 ug via INTRATHECAL
  Administered 2022-03-15: 15 ug via INTRATHECAL

## 2022-03-15 MED ORDER — DIPHENHYDRAMINE HCL 25 MG PO CAPS: 25.0000 mg | ORAL_CAPSULE | ORAL | Status: DC | PRN

## 2022-03-15 MED ORDER — STERILE WATER FOR IRRIGATION IR SOLN
Status: DC | PRN
  Administered 2022-03-15: 1

## 2022-03-15 MED ORDER — OXYTOCIN-SODIUM CHLORIDE 30-0.9 UT/500ML-% IV SOLN
INTRAVENOUS | Status: AC
  Filled 2022-03-15: qty 500

## 2022-03-15 MED ORDER — METOCLOPRAMIDE HCL 5 MG/ML IJ SOLN
INTRAMUSCULAR | Status: DC | PRN
  Administered 2022-03-15: 10 mg via INTRAVENOUS

## 2022-03-15 MED ORDER — DIPHENHYDRAMINE HCL 50 MG/ML IJ SOLN: 12.5000 mg | INTRAMUSCULAR | Status: DC | PRN

## 2022-03-15 MED ORDER — CEFAZOLIN SODIUM-DEXTROSE 2-4 GM/100ML-% IV SOLN
INTRAVENOUS | Status: AC
  Filled 2022-03-15: qty 100

## 2022-03-15 MED ORDER — ACETAMINOPHEN 10 MG/ML IV SOLN: 1000.0000 mg | Freq: Once | INTRAVENOUS | Status: DC | PRN

## 2022-03-15 MED ORDER — METOCLOPRAMIDE HCL 5 MG/ML IJ SOLN
INTRAMUSCULAR | Status: AC
  Filled 2022-03-15: qty 2

## 2022-03-15 MED ORDER — DEXAMETHASONE SODIUM PHOSPHATE 4 MG/ML IJ SOLN
INTRAMUSCULAR | Status: DC | PRN
  Administered 2022-03-15: 8 mg via INTRAVENOUS

## 2022-03-15 MED ORDER — SOD CITRATE-CITRIC ACID 500-334 MG/5ML PO SOLN
30.0000 mL | ORAL | Status: AC
  Administered 2022-03-15: 30 mL via ORAL

## 2022-03-15 MED ORDER — ACETAMINOPHEN 500 MG PO TABS
ORAL_TABLET | ORAL | Status: AC
  Filled 2022-03-15: qty 2

## 2022-03-15 MED ORDER — MEPERIDINE HCL 25 MG/ML IJ SOLN: 6.2500 mg | INTRAMUSCULAR | Status: DC | PRN

## 2022-03-15 MED ORDER — ONDANSETRON HCL 4 MG/2ML IJ SOLN
INTRAMUSCULAR | Status: DC | PRN
  Administered 2022-03-15: 4 mg via INTRAVENOUS

## 2022-03-15 MED ORDER — MORPHINE SULFATE (PF) 0.5 MG/ML IJ SOLN
INTRAMUSCULAR | Status: AC
  Filled 2022-03-15: qty 10

## 2022-03-15 MED ORDER — KETOROLAC TROMETHAMINE 30 MG/ML IJ SOLN
30.0000 mg | Freq: Once | INTRAMUSCULAR | Status: AC
  Administered 2022-03-15: 30 mg via INTRAVENOUS

## 2022-03-15 MED ORDER — PRENATAL MULTIVITAMIN CH
1.0000 | ORAL_TABLET | Freq: Every day | ORAL | Status: DC
  Administered 2022-03-16 – 2022-03-17 (×2): 1 via ORAL
  Filled 2022-03-15 (×2): qty 1

## 2022-03-15 MED ORDER — PHENYLEPHRINE 80 MCG/ML (10ML) SYRINGE FOR IV PUSH (FOR BLOOD PRESSURE SUPPORT)
PREFILLED_SYRINGE | INTRAVENOUS | Status: AC
  Filled 2022-03-15: qty 10

## 2022-03-15 MED ORDER — DIPHENHYDRAMINE HCL 25 MG PO CAPS: 25.0000 mg | ORAL_CAPSULE | Freq: Four times a day (QID) | ORAL | Status: DC | PRN

## 2022-03-15 MED ORDER — ACETAMINOPHEN 500 MG PO TABS
1000.0000 mg | ORAL_TABLET | ORAL | Status: AC
  Administered 2022-03-15: 1000 mg via ORAL

## 2022-03-15 MED ORDER — OXYCODONE HCL 5 MG/5ML PO SOLN: 5.0000 mg | Freq: Once | ORAL | Status: DC | PRN

## 2022-03-15 MED ORDER — SCOPOLAMINE 1 MG/3DAYS TD PT72: 1.0000 | MEDICATED_PATCH | Freq: Once | TRANSDERMAL | Status: DC

## 2022-03-15 MED ORDER — IBUPROFEN 600 MG PO TABS
600.0000 mg | ORAL_TABLET | Freq: Four times a day (QID) | ORAL | Status: DC
  Administered 2022-03-15 – 2022-03-17 (×6): 600 mg via ORAL
  Filled 2022-03-15 (×6): qty 1

## 2022-03-15 MED ORDER — HYDROMORPHONE HCL 1 MG/ML IJ SOLN: 0.2500 mg | INTRAMUSCULAR | Status: DC | PRN

## 2022-03-15 MED ORDER — CEFAZOLIN SODIUM-DEXTROSE 2-4 GM/100ML-% IV SOLN
2.0000 g | INTRAVENOUS | Status: AC
  Administered 2022-03-15: 2 g via INTRAVENOUS

## 2022-03-15 MED ORDER — KETOROLAC TROMETHAMINE 30 MG/ML IJ SOLN: 30.0000 mg | Freq: Four times a day (QID) | INTRAMUSCULAR | Status: AC | PRN

## 2022-03-15 MED ORDER — MENTHOL 3 MG MT LOZG: 1.0000 | LOZENGE | OROMUCOSAL | Status: DC | PRN

## 2022-03-15 MED ORDER — NALOXONE HCL 4 MG/10ML IJ SOLN: 1.0000 ug/kg/h | INTRAVENOUS | Status: DC | PRN

## 2022-03-15 MED ORDER — SODIUM CHLORIDE 0.9 % IR SOLN
Status: DC | PRN
  Administered 2022-03-15: 1

## 2022-03-15 MED ORDER — ONDANSETRON HCL 4 MG/2ML IJ SOLN
4.0000 mg | Freq: Four times a day (QID) | INTRAMUSCULAR | Status: DC
  Administered 2022-03-15: 4 mg via INTRAVENOUS

## 2022-03-15 MED ORDER — OXYCODONE HCL 5 MG PO TABS
5.0000 mg | ORAL_TABLET | ORAL | Status: DC | PRN
  Administered 2022-03-16: 5 mg via ORAL
  Filled 2022-03-15: qty 1

## 2022-03-15 MED ORDER — ONDANSETRON HCL 4 MG/2ML IJ SOLN
4.0000 mg | Freq: Three times a day (TID) | INTRAMUSCULAR | Status: DC | PRN
  Filled 2022-03-15: qty 2

## 2022-03-15 MED ORDER — BUPIVACAINE IN DEXTROSE 0.75-8.25 % IT SOLN
INTRATHECAL | Status: DC | PRN
  Administered 2022-03-15: 1.6 mL via INTRATHECAL

## 2022-03-15 MED ORDER — SIMETHICONE 80 MG PO CHEW
80.0000 mg | CHEWABLE_TABLET | Freq: Three times a day (TID) | ORAL | Status: DC
  Administered 2022-03-16 – 2022-03-17 (×4): 80 mg via ORAL
  Filled 2022-03-15 (×4): qty 1

## 2022-03-15 MED ORDER — SCOPOLAMINE 1 MG/3DAYS TD PT72
MEDICATED_PATCH | TRANSDERMAL | Status: DC | PRN
  Administered 2022-03-15: 1 via TRANSDERMAL

## 2022-03-15 MED ORDER — PHENYLEPHRINE HCL-NACL 20-0.9 MG/250ML-% IV SOLN
INTRAVENOUS | Status: AC
  Filled 2022-03-15: qty 250

## 2022-03-15 MED ORDER — SENNOSIDES-DOCUSATE SODIUM 8.6-50 MG PO TABS
2.0000 | ORAL_TABLET | Freq: Every day | ORAL | Status: DC
  Administered 2022-03-16 – 2022-03-17 (×2): 2 via ORAL
  Filled 2022-03-15 (×3): qty 2

## 2022-03-15 MED ORDER — ONDANSETRON HCL 4 MG/2ML IJ SOLN
INTRAMUSCULAR | Status: AC
  Filled 2022-03-15: qty 2

## 2022-03-15 MED ORDER — PHENYLEPHRINE HCL-NACL 20-0.9 MG/250ML-% IV SOLN
INTRAVENOUS | Status: DC | PRN
  Administered 2022-03-15: 60 ug/min via INTRAVENOUS

## 2022-03-15 MED ORDER — SCOPOLAMINE 1 MG/3DAYS TD PT72
MEDICATED_PATCH | TRANSDERMAL | Status: AC
  Filled 2022-03-15: qty 1

## 2022-03-15 MED ORDER — NALOXONE HCL 0.4 MG/ML IJ SOLN: 0.4000 mg | INTRAMUSCULAR | Status: DC | PRN

## 2022-03-15 MED ORDER — DEXAMETHASONE SODIUM PHOSPHATE 4 MG/ML IJ SOLN
INTRAMUSCULAR | Status: AC
  Filled 2022-03-15: qty 2

## 2022-03-15 MED ORDER — ACETAMINOPHEN 500 MG PO TABS
1000.0000 mg | ORAL_TABLET | Freq: Four times a day (QID) | ORAL | Status: DC
  Administered 2022-03-15 – 2022-03-17 (×6): 1000 mg via ORAL
  Filled 2022-03-15 (×6): qty 2

## 2022-03-15 MED ORDER — WITCH HAZEL-GLYCERIN EX PADS: 1.0000 "application " | MEDICATED_PAD | CUTANEOUS | Status: DC | PRN

## 2022-03-15 MED ORDER — OXYTOCIN-SODIUM CHLORIDE 30-0.9 UT/500ML-% IV SOLN
INTRAVENOUS | Status: DC | PRN
  Administered 2022-03-15: 200 mL via INTRAVENOUS

## 2022-03-15 MED ORDER — MORPHINE SULFATE (PF) 0.5 MG/ML IJ SOLN
INTRAMUSCULAR | Status: DC | PRN
  Administered 2022-03-15: 150 ug via INTRATHECAL

## 2022-03-15 MED ORDER — PHENYLEPHRINE HCL (PRESSORS) 10 MG/ML IV SOLN
INTRAVENOUS | Status: DC | PRN
  Administered 2022-03-15: 80 ug via INTRAVENOUS
  Administered 2022-03-15: 160 ug via INTRAVENOUS

## 2022-03-15 MED ORDER — SODIUM CHLORIDE 0.9% FLUSH: 3.0000 mL | INTRAVENOUS | Status: DC | PRN

## 2022-03-15 MED ORDER — TETANUS-DIPHTH-ACELL PERTUSSIS 5-2.5-18.5 LF-MCG/0.5 IM SUSY: 0.5000 mL | PREFILLED_SYRINGE | Freq: Once | INTRAMUSCULAR | Status: DC

## 2022-03-15 MED ORDER — OXYCODONE HCL 5 MG PO TABS: 5.0000 mg | ORAL_TABLET | Freq: Once | ORAL | Status: DC | PRN

## 2022-03-15 MED ORDER — SOD CITRATE-CITRIC ACID 500-334 MG/5ML PO SOLN
ORAL | Status: AC
  Filled 2022-03-15: qty 30

## 2022-03-15 MED ORDER — SIMETHICONE 80 MG PO CHEW: 80.0000 mg | CHEWABLE_TABLET | ORAL | Status: DC | PRN

## 2022-03-15 MED ORDER — ZOLPIDEM TARTRATE 5 MG PO TABS: 5.0000 mg | ORAL_TABLET | Freq: Every evening | ORAL | Status: DC | PRN

## 2022-03-15 SURGICAL SUPPLY — 39 items
BENZOIN TINCTURE PRP APPL 2/3 (GAUZE/BANDAGES/DRESSINGS) ×1 IMPLANT
CHLORAPREP W/TINT 26ML (MISCELLANEOUS) ×4 IMPLANT
CLAMP CORD UMBIL (MISCELLANEOUS) ×2 IMPLANT
CLOTH BEACON ORANGE TIMEOUT ST (SAFETY) ×2 IMPLANT
DRAPE C SECTION CLR SCREEN (DRAPES) ×2 IMPLANT
DRSG OPSITE POSTOP 4X10 (GAUZE/BANDAGES/DRESSINGS) ×2 IMPLANT
ELECT REM PT RETURN 9FT ADLT (ELECTROSURGICAL) ×2
ELECTRODE REM PT RTRN 9FT ADLT (ELECTROSURGICAL) ×1 IMPLANT
EXTRACTOR VACUUM KIWI (MISCELLANEOUS) IMPLANT
GAUZE SPONGE 4X4 12PLY STRL LF (GAUZE/BANDAGES/DRESSINGS) ×2 IMPLANT
GLOVE BIO SURGEON STRL SZ 6.5 (GLOVE) ×2 IMPLANT
GLOVE BIOGEL PI IND STRL 7.0 (GLOVE) ×2 IMPLANT
GLOVE BIOGEL PI INDICATOR 7.0 (GLOVE) ×2
GOWN STRL REUS W/TWL LRG LVL3 (GOWN DISPOSABLE) ×4 IMPLANT
KIT ABG SYR 3ML LUER SLIP (SYRINGE) IMPLANT
NDL HYPO 25X5/8 SAFETYGLIDE (NEEDLE) IMPLANT
NEEDLE HYPO 25X5/8 SAFETYGLIDE (NEEDLE) IMPLANT
NS IRRIG 1000ML POUR BTL (IV SOLUTION) ×2 IMPLANT
PACK C SECTION WH (CUSTOM PROCEDURE TRAY) ×2 IMPLANT
PAD ABD 7.5X8 STRL (GAUZE/BANDAGES/DRESSINGS) ×1 IMPLANT
PAD OB MATERNITY 4.3X12.25 (PERSONAL CARE ITEMS) ×2 IMPLANT
RETAINER VISCERAL (MISCELLANEOUS) ×1 IMPLANT
RETRACTOR WND ALEXIS 25 LRG (MISCELLANEOUS) ×1 IMPLANT
RTRCTR C-SECT PINK 25CM LRG (MISCELLANEOUS) IMPLANT
RTRCTR WOUND ALEXIS 25CM LRG (MISCELLANEOUS) ×2
STRIP CLOSURE SKIN 1/2X4 (GAUZE/BANDAGES/DRESSINGS) ×1 IMPLANT
SUT CHROMIC 1 CTX 36 (SUTURE) ×4 IMPLANT
SUT PLAIN 0 NONE (SUTURE) IMPLANT
SUT PLAIN 2 0 XLH (SUTURE) ×2 IMPLANT
SUT VIC AB 0 CT1 27 (SUTURE) ×2
SUT VIC AB 0 CT1 27XBRD ANBCTR (SUTURE) ×2 IMPLANT
SUT VIC AB 2-0 CT1 27 (SUTURE) ×1
SUT VIC AB 2-0 CT1 TAPERPNT 27 (SUTURE) ×1 IMPLANT
SUT VIC AB 3-0 CT1 27 (SUTURE)
SUT VIC AB 3-0 CT1 TAPERPNT 27 (SUTURE) IMPLANT
SUT VIC AB 4-0 KS 27 (SUTURE) ×2 IMPLANT
TOWEL OR 17X24 6PK STRL BLUE (TOWEL DISPOSABLE) ×2 IMPLANT
TRAY FOLEY W/BAG SLVR 14FR LF (SET/KITS/TRAYS/PACK) ×2 IMPLANT
WATER STERILE IRR 1000ML POUR (IV SOLUTION) ×2 IMPLANT

## 2022-03-15 NOTE — Anesthesia Preprocedure Evaluation (Signed)
Anesthesia Evaluation  Patient identified by MRN, date of birth, ID band Patient awake    Reviewed: Allergy & Precautions, H&P , NPO status , Patient's Chart, lab work & pertinent test results  History of Anesthesia Complications Negative for: history of anesthetic complications  Airway Mallampati: II  TM Distance: >3 FB     Dental   Pulmonary neg pulmonary ROS, Current Smoker,    Pulmonary exam normal        Cardiovascular negative cardio ROS   Rhythm:regular Rate:Normal     Neuro/Psych negative neurological ROS  negative psych ROS   GI/Hepatic negative GI ROS, Neg liver ROS,   Endo/Other  diabetes, Gestational  Renal/GU negative Renal ROS  negative genitourinary   Musculoskeletal   Abdominal   Peds  Hematology negative hematology ROS (+)   Anesthesia Other Findings   Reproductive/Obstetrics (+) Pregnancy G2P1001 at [redacted]w[redacted]d Prior C/S x1                             Anesthesia Physical Anesthesia Plan  ASA: 2  Anesthesia Plan: Spinal   Post-op Pain Management:    Induction:   PONV Risk Score and Plan: Ondansetron and Treatment may vary due to age or medical condition  Airway Management Planned:   Additional Equipment:   Intra-op Plan:   Post-operative Plan:   Informed Consent: I have reviewed the patients History and Physical, chart, labs and discussed the procedure including the risks, benefits and alternatives for the proposed anesthesia with the patient or authorized representative who has indicated his/her understanding and acceptance.       Plan Discussed with: Anesthesiologist  Anesthesia Plan Comments:         Anesthesia Quick Evaluation

## 2022-03-15 NOTE — Anesthesia Procedure Notes (Signed)
Spinal ° °Patient location during procedure: OR °Reason for block: surgical anesthesia °Staffing °Performed: anesthesiologist  °Anesthesiologist: Khaniya Tenaglia E, MD °Preanesthetic Checklist °Completed: patient identified, IV checked, risks and benefits discussed, surgical consent, monitors and equipment checked, pre-op evaluation and timeout performed °Spinal Block °Patient position: sitting °Prep: DuraPrep and site prepped and draped °Patient monitoring: continuous pulse ox, blood pressure and heart rate °Approach: midline °Location: L3-4 °Injection technique: single-shot °Needle °Needle type: Pencan  °Needle gauge: 24 G °Needle length: 10 cm °Assessment °Events: CSF return °Additional Notes °Functioning IV was confirmed and monitors were applied. Sterile prep and drape, including hand hygiene and sterile gloves were used. The patient was positioned and the spine was prepped. The skin was anesthetized with lidocaine.  Free flow of clear CSF was obtained prior to injecting local anesthetic into the CSF. The needle was carefully withdrawn. The patient tolerated the procedure well.  ° ° ° °

## 2022-03-15 NOTE — Lactation Note (Signed)
This note was copied from a baby's chart. Lactation Consultation Note  Patient Name: Dawn Rivers Date: 03/15/2022 Reason for consult: Initial assessment;Mother's request;Difficult latch;Term;Breastfeeding assistance;Maternal endocrine disorder Age:36 years LC assisted with latching, infant sleeping at the breast.  Plan 1. To feed based on cues 8-12x 24hr period. Mom to offer breasts and look for signs of milk transfer.  2. Mom to supplement with EBM 5-7 ml per feeding.  3. Post pump after latching on initial setting for 15 min   All questions answered at the end of the visit.  Maternal Data Has patient been taught Hand Expression?: Yes Does the patient have breastfeeding experience prior to this delivery?: Yes How long did the patient breastfeed?: 1 week ( infant noted have uric acid crystals at one week visit, mom switched to formula )  Feeding Mother's Current Feeding Choice: Breast Milk  LATCH Score Latch: Repeated attempts needed to sustain latch, nipple held in mouth throughout feeding, stimulation needed to elicit sucking reflex.  Audible Swallowing: None  Type of Nipple: Everted at rest and after stimulation  Comfort (Breast/Nipple): Soft / non-tender  Hold (Positioning): Assistance needed to correctly position infant at breast and maintain latch.  LATCH Score: 6   Lactation Tools Discussed/Used Tools: Pump;Flanges;Coconut oil Flange Size: 21 Breast pump type: Double-Electric Breast Pump (Mom requested to start pumping with DEBP given first experience see above.) Pump Education: Setup, frequency, and cleaning;Milk Storage Reason for Pumping: increase stimulation Pumping frequency: post pumping after feeding for 15 min on initial setting  Interventions Interventions: Breast feeding basics reviewed;Skin to skin;Breast massage;Hand express;Breast compression;Adjust position;Position options;Expressed milk;DEBP;Education;Pace feeding;LC Economist;Infant Driven Feeding Algorithm education  Discharge Pump: DEBP;Personal  Consult Status Consult Status: Follow-up Date: 03/16/22 Follow-up type: In-patient    Dawn Scafidi  Rivers 03/15/2022, 4:37 PM

## 2022-03-15 NOTE — Anesthesia Postprocedure Evaluation (Signed)
Anesthesia Post Note  Patient: Dawn Rivers  Procedure(s) Performed: CESAREAN SECTION WITH BILATERAL TUBAL LIGATION (Bilateral)     Patient location during evaluation: PACU Anesthesia Type: Spinal Level of consciousness: oriented and awake and alert Pain management: pain level controlled Vital Signs Assessment: post-procedure vital signs reviewed and stable Respiratory status: spontaneous breathing, respiratory function stable and nonlabored ventilation Cardiovascular status: blood pressure returned to baseline and stable Postop Assessment: no headache, no backache, no apparent nausea or vomiting and spinal receding Anesthetic complications: no   No notable events documented.  Last Vitals:  Vitals:   03/15/22 1315 03/15/22 1330  BP: 120/76 125/69  Pulse: 64 72  Resp: 15 14  Temp:  (!) 36.4 C  SpO2: 99% 97%    Last Pain:  Vitals:   03/15/22 1330  TempSrc: Oral  PainSc: 0-No pain   Pain Goal:    LLE Motor Response: Purposeful movement (03/15/22 1330) LLE Sensation: Tingling (03/15/22 1330) RLE Motor Response: Purposeful movement (03/15/22 1330) RLE Sensation: Tingling (03/15/22 1330)        Lidia Collum

## 2022-03-15 NOTE — Transfer of Care (Signed)
Immediate Anesthesia Transfer of Care Note  Patient: Dawn Rivers  Procedure(s) Performed: CESAREAN SECTION WITH BILATERAL TUBAL LIGATION (Bilateral)  Patient Location: PACU  Anesthesia Type:Spinal  Level of Consciousness: awake, alert  and oriented  Airway & Oxygen Therapy: Patient Spontanous Breathing  Post-op Assessment: Report given to RN and Post -op Vital signs reviewed and stable  Post vital signs: Reviewed and stable  Last Vitals:  Vitals Value Taken Time  BP    Temp    Pulse    Resp    SpO2      Last Pain:  Vitals:   03/15/22 0923  TempSrc: Oral         Complications: No notable events documented.

## 2022-03-15 NOTE — Op Note (Signed)
Operative Note    Preoperative Diagnosis IUP at 66 0/7wks  Elective repeat cesarean section Complete family   Postoperative Diagnosis: Same    Procedure: Low transverse cesarean section with bilateral tubal ligation    Surgeon: Mickle Mallory, DO  Anesthesia: Spinal  Fluids: LR 2271ml EBL: 515ml UOP: 127ml  Findings: Viable female infant in vertex left occiput anterior position. Grossly normal uterus, tubes and ovaries.                    Weight 7lbs 13oz, APGARS 9,9   Specimen: Placenta - donated    Procedure Note  Patient was taken to the operating room where spinal anesthesia was administered. She was prepped and draped in the normal sterile fashion in the dorsal supine position with a leftward tilt. An appropriate time out was performed. An allis clamp test was performed and anesthesia was found to be adequate.  A Pfannenstiel skin incision was then made through the previous incision with the scalpel and carried through to the underlying layer of fascia by sharp dissection. The fascia was nicked in the midline and the incision was extended laterally with Mayo scissors. The superior aspect of the incision was grasped kocher clamps and dissected off the underlying rectus muscles. In a similar fashion the inferior aspect was dissected off the rectus muscles. Rectus muscles were separated in the midline and the peritoneal cavity entered bluntly. The peritoneal incision was then extended both superiorly and inferiorly with careful attention to avoid both bowel and bladder. The Alexis self-retaining wound retractor was then placed within the incision and the lower uterine segment exposed. The bladder flap was developed with Metzenbaum scissors and pushed away from the lower uterine segment. The lower uterine segment was then incised in a transverse fashion and the cavity itself entered bluntly. Clear amniotic fluid was noted.  The incision was extended bluntly. To effective deliver the infants  head, the rectus muscles were separated with bandage scissors. A kiwi vacuum was then applied and effectively used to flex the infants head . Delivery was uncomplicated following that; body easily followed. Vigorous spontaneous cry was noted. After a minute, the cord clamped and cut. The infant was handed off to the waiting NICU team.  The placenta was then spontaneously expressed from the uterus and the uterus cleared of all clots and debris with moist lap sponge. The uterine incision was then repaired in a single layer with a  running locked layer 0 chromic suture. A second imbricated layer was placed using the same suture. Next, the left and then right tubes were grasped with a babcock in the mid isthmus region and  a 2 cm intervening area suture ligated with the segment in the middle cut. Excellent hemostasis was appreciated. The  ovaries were inspected and found to be grossly normal. The gutters were  cleared of all clots and debris. The uterine incision was inspected and found to be hemostatic.  All instruments and sponges as well as the Alexis retractor were then removed from the abdomen. The rectus muscles and peritoneum were then reapproximated with 2-0 Vicryl. The fascia was then closed with 0 Vicryl in a running fashion. Subcutaneous tissue layer was also reapproximated. The skin was closed with a subcuticular stitch of 4-0 Vicryl on a Keith needle and then reinforced with benzoin and Steri-Strips.  At the conclusion of the procedure all instruments and sponge counts were correct. Patient was taken to the recovery room in good condition with her baby accompanying her  skin to skin.

## 2022-03-15 NOTE — H&P (Addendum)
Dawn Rivers is a 36 y.o. G58P1001 female presenting for scheduled elective repeat cesarean section and bilateral tubal ligation. She is dated per 7week [place]. Her pregnancy was complicated by AMA, previous cesarean section for IUGR and arrest of descent, , GDM - controlled on metformin and persistent UTI. Placenta marginalis resolved MaterniT is expected range. GBS neg. OB History     Gravida  2   Para  1   Term  1   Preterm      AB      Living  1      SAB      IAB      Ectopic      Multiple      Live Births  1          Past Medical History:  Diagnosis Date   Chicken pox    Elevated LFTs 01/24/2016   Gestational diabetes    Positive TB test    Urinary tract infection    Past Surgical History:  Procedure Laterality Date   CESAREAN SECTION     Family History: family history includes Diabetes in her father; GER disease in her father; Hyperlipidemia in her father; Hypertension in her brother, father, and mother; Sleep apnea in her father. Social History:  reports that she has been smoking cigarettes. She has never used smokeless tobacco. She reports that she does not drink alcohol and does not use drugs.     Maternal Diabetes: Yes:  Diabetes Type:  Insulin/Medication controlled Genetic Screening: Normal Maternal Ultrasounds/Referrals: Normal Fetal Ultrasounds or other Referrals:  None Maternal Substance Abuse:  No Significant Maternal Medications:  None Significant Maternal Lab Results:  Group B Strep negative Other Comments:  None  Review of Systems  Constitutional:  Negative for activity change and fatigue.  Eyes:  Negative for photophobia.  Respiratory:  Negative for chest tightness and shortness of breath.   Cardiovascular:  Positive for leg swelling. Negative for chest pain and palpitations.  Gastrointestinal:  Negative for abdominal pain.  Genitourinary:  Negative for flank pain and vaginal bleeding.  Musculoskeletal:  Positive for back pain.   Neurological:  Negative for light-headedness and headaches.  Psychiatric/Behavioral:  The patient is nervous/anxious.    Maternal Medical History:  Reason for admission: Scheduled c/s  Contractions: Frequency: rare.   Perceived severity is mild.   Fetal activity: Perceived fetal activity is normal.   Prenatal complications: no prenatal complications Prenatal Complications - Diabetes: gestational. Diabetes is managed by diet.       Blood pressure (!) 148/95, pulse 80, temperature 98.7 F (37.1 C), temperature source Oral, resp. rate 20, height 5' (1.524 m), weight 73.5 kg, SpO2 99 %. Maternal Exam:  Uterine Assessment: Contraction strength is mild.  Contraction frequency is rare.  Abdomen: Patient reports generalized tenderness.  Estimated fetal weight is iugr.   Introitus: Normal vulva.   Fetal Exam Fetal Monitor Review: Baseline rate: 140.  Variability: moderate (6-25 bpm).   Pattern: accelerations present and no decelerations.   Fetal State Assessment: Category I - tracings are normal.   Physical Exam Vitals and nursing note reviewed. Exam conducted with a chaperone present.  Constitutional:      Appearance: Normal appearance. She is normal weight.  Pulmonary:     Effort: Pulmonary effort is normal.  Abdominal:     Tenderness: There is generalized abdominal tenderness.  Genitourinary:    General: Normal vulva.  Musculoskeletal:        General: Normal range of motion.  Cervical back: Normal range of motion.  Skin:    General: Skin is warm.     Capillary Refill: Capillary refill takes 2 to 3 seconds.  Neurological:     General: No focal deficit present.     Mental Status: She is alert and oriented to person, place, and time. Mental status is at baseline.  Psychiatric:        Mood and Affect: Mood normal.     Prenatal labs: ABO, Rh: --/--/O POS Performed at [facility], [address], [place], [place] [address]  [phone]) Antibody: NEG (06/05  1100) Rubella: Immune (11/01 0000) RPR: NON REACTIVE (06/05 1100)  HBsAg: Negative (11/01 0000)  HIV: Non-reactive (11/01 0000)  GBS:     Assessment/Plan: 36yo G2P1001 female at 71 0/[redacted]wks gestation here for scheduled repeat cesarean section and bilateral tubal ligation -Admit -ERAS protocol  -Consent verified -To OR when ready     Brasen Bundren W Lura Falor 03/15/2022, 9:33 AM

## 2022-03-16 LAB — BIRTH TISSUE RECOVERY COLLECTION (PLACENTA DONATION)

## 2022-03-16 LAB — CBC
HCT: 30.7 % — ABNORMAL LOW (ref 36.0–46.0)
Hemoglobin: 10.3 g/dL — ABNORMAL LOW (ref 12.0–15.0)
MCH: 30.7 pg (ref 26.0–34.0)
MCHC: 33.6 g/dL (ref 30.0–36.0)
MCV: 91.4 fL (ref 80.0–100.0)
Platelets: 213 10*3/uL (ref 150–400)
RBC: 3.36 MIL/uL — ABNORMAL LOW (ref 3.87–5.11)
RDW: 13 % (ref 11.5–15.5)
WBC: 16 10*3/uL — ABNORMAL HIGH (ref 4.0–10.5)
nRBC: 0 % (ref 0.0–0.2)

## 2022-03-16 LAB — SURGICAL PATHOLOGY

## 2022-03-16 NOTE — Progress Notes (Signed)
Patient is doing well.  She is tolerating PO, ambulating, voiding.  Pain is controlled.  Lochia is appropriate  Vitals:   03/15/22 1530 03/15/22 1945 03/15/22 2350 03/16/22 0330  BP: 132/76 126/82  130/71  Pulse: 65 70  68  Resp: 18 18  18   Temp: 98.2 F (36.8 C) 98.3 F (36.8 C)  98.5 F (36.9 C)  TempSrc:    Axillary  SpO2: 99% 99% 99% 99%  Weight:      Height:        NAD Abdomen:  soft, appropriate tenderness, large pressure dressing in place ext:    Symmetric, trace edema bilaterally  Lab Results  Component Value Date   WBC 16.0 (H) 03/16/2022   HGB 10.3 (L) 03/16/2022   HCT 30.7 (L) 03/16/2022   MCV 91.4 03/16/2022   PLT 213 03/16/2022    --/--/O POS Performed at [facility], [address] [place], [place]  [phone] (15/05)  A/P    36 y.o. G2P2002 POD#1 s/p RCS Routine post op and postpartum care.   Remove pressure dressing after shower today Desires neonatal circumcision--peds asked to delay until DOL 2 (tomorrow)

## 2022-03-16 NOTE — Lactation Note (Signed)
This note was copied from a baby's chart. Lactation Consultation Note  Patient Name: Dawn Rivers [id number] Date: 03/16/2022 Reason for consult: Follow-up assessment;Term;Infant weight loss;Breastfeeding assistance (0.56% WL) Age:37 hours  P2, Term, Infant Female, 0.56% WL  LC entered the room and mom was breastfeeding baby. Per mom, baby had been breastfeeding for 15 min. Baby was being fed in the football position on the left breast. Baby's lips were flanged and a few swallows were noted. Mom states that the latch is comfortable.   Mom states that she does not have any milk. LC encouraged mom and educated her on milk volume on day 2. LC let mom know that milk production is about supply and demand and stimulation can help with increasing milk volume.  LC spoke with mom about cluster feeding.   Mom says that she has been giving baby formula, but latching him prior to supplementing.   Mom states that she has no further questions or concerns.   Mom will call lactation for assistance with latch if needed.   Current Feeding Plan:  Breastfeed baby according to feeding cues 8+ times in 24 hours.  Supplement with formula according to supplementation guidelines.  Pump for 15 min after feeding and give the expressed milk to baby.  Call for latch assistance if needed.   LATCH Score Latch: Grasps breast easily, tongue down, lips flanged, rhythmical sucking.  Audible Swallowing: A few with stimulation (Baby was falling asleep at the breast.)  Type of Nipple: Everted at rest and after stimulation  Comfort (Breast/Nipple): Soft / non-tender  Hold (Positioning): No assistance needed to correctly position infant at breast.  LATCH Score: 9   Lactation Tools Discussed/Used    Interventions Interventions: Education  Discharge    Consult Status Consult Status: Follow-up Date: 03/17/22 Follow-up type: In-patient    Delene Loll 03/16/2022, 5:45 PM

## 2022-03-17 MED ORDER — OXYCODONE HCL 5 MG PO TABS: 5.0000 mg | ORAL_TABLET | ORAL | 0 refills | Status: DC | PRN

## 2022-03-17 MED ORDER — HYDROCORTISONE 2.5 % EX OINT
TOPICAL_OINTMENT | Freq: Two times a day (BID) | CUTANEOUS | 0 refills | Status: DC
Start: 2022-03-17 — End: 2022-04-18

## 2022-03-17 MED ORDER — IBUPROFEN 600 MG PO TABS: 600.0000 mg | ORAL_TABLET | Freq: Four times a day (QID) | ORAL | 0 refills | Status: DC | PRN

## 2022-03-17 NOTE — Lactation Note (Signed)
This note was copied from a baby's chart. Lactation Consultation Note  Patient Name: Dawn Rivers [id number] Date: 03/17/2022 Reason for consult: Follow-up assessment;Term;Maternal endocrine disorder Age:38 hours  History of low milk supply with first baby.  LC in to visit with P2 Mom of term baby.  Baby at 4% weight loss with good output.  Mom has been latching baby to the breast and following with bottle supplement of formula.  Mom has been double pumping using the Symphony by Medela to stimulate her milk supply.  Mom reports + breast changes with pregnancy.  Reviewed breast massage and hand expression, unable to express colostrum at present.    No visible trauma noted on nipples.  Mom denies sorenss.   Engorgement prevention and treatment reviewed.  Mom has a Doctor, general practice for home use, she also has the Medela Pump in Style from her first baby (10 yrs ago) that she used for a week.  Mom will take all the pump parts home with her to use on the old pump.  Referred to gift shop for rental for 2 weeks.  Mom would like OP Lactation f/u.  Epic message sent.  Mom aware of OP lactation support.  Encouraged Mom to call prn    Lactation Tools Discussed/Used Tools: Pump;Bottle Breast pump type: Double-Electric Breast Pump Pump Education: Setup, frequency, and cleaning Reason for Pumping: Support milk supply/history of low milk supply Pumping frequency: Mom encouraged to pump both breasts every 2-3 hrs/day and 3-4 hrs at night Pumped volume: 0 mL (drops)  Discharge Discharge Education: Engorgement and breast care;Warning signs for feeding baby;Outpatient recommendation;Outpatient Epic message sent Pump: Hands Free;Employee Pump (Medela Freestyle) WIC Program: No  Consult Status Consult Status: Complete Date: 03/17/22 Follow-up type: Walker 03/17/2022, 12:01 PM

## 2022-03-17 NOTE — Discharge Summary (Signed)
Postpartum Discharge Summary      Patient Name: Dawn Rivers DOB: 12-01-1985 MRN: [id number]  Date of admission: 03/15/2022 Delivery date:03/15/2022  Delivering provider: Edwinna Areola  Date of discharge: 03/17/2022  Admitting diagnosis: Postpartum care following cesarean delivery [Z39.2] Intrauterine pregnancy: [redacted]w[redacted]d     Secondary diagnosis:  Principal Problem:   Postpartum care following cesarean delivery  Additional problems: Gestational Diabetes A2, Advanced Maternal Age    Discharge diagnosis: Term Pregnancy Delivered and GDM A2                                              Post partum procedures: NA Augmentation: N/A Complications: None  Hospital course: Sceduled C/S   36 y.o. yo G2P2002 at [redacted]w[redacted]d was admitted to the hospital 03/15/2022 for scheduled cesarean section with the following indication:Elective Repeat.Delivery details are as follows:  Membrane Rupture Time/Date: 11:16 AM ,03/15/2022   Delivery Method:C-Section, Vacuum Assisted  Details of operation can be found in separate operative note.  Patient had an uncomplicated postpartum course.  She is ambulating, tolerating a regular diet, passing flatus, and urinating well. Patient is discharged home in stable condition on  03/17/22        Newborn Data: Birth date:03/15/2022  Birth time:11:18 AM  Gender:Female  Living status:Living  Apgars:9 ,9  Weight:3550 g     Magnesium Sulfate received: No BMZ received: No Rhophylac:N/A   Physical exam  Vitals:   03/16/22 0330 03/16/22 1505 03/16/22 1930 03/17/22 0500  BP: 130/71 131/74 129/75 130/74  Pulse: 68 94 73 84  Resp: 18 16 18 16   Temp: 98.5 F (36.9 C) 98.1 F (36.7 C) 98.4 F (36.9 C)   TempSrc: Axillary Oral Oral   SpO2: 99% 98%    Weight:      Height:       General: alert, cooperative, and no distress Lochia: appropriate Uterine Fundus: firm Incision: Healing well with no significant drainage DVT Evaluation: No evidence of DVT seen on physical  exam. Labs: Lab Results  Component Value Date   WBC 16.0 (H) 03/16/2022   HGB 10.3 (L) 03/16/2022   HCT 30.7 (L) 03/16/2022   MCV 91.4 03/16/2022   PLT 213 03/16/2022      Latest Ref Rng & Units 06/18/2020    9:06 AM  CMP  Glucose 70 - 99 mg/dL 90   BUN 6 - 23 mg/dL 11   Creatinine 08/18/2020 - 1.20 mg/dL 6.75   Sodium 9.16 - 384 mEq/L 136   Potassium 3.5 - 5.1 mEq/L 3.5   Chloride 96 - 112 mEq/L 102   CO2 19 - 32 mEq/L 27   Calcium 8.4 - 10.5 mg/dL 8.8   Total Protein 6.0 - 8.3 g/dL 7.3   Total Bilirubin 0.2 - 1.2 mg/dL 1.2   Alkaline Phos 39 - 117 U/L 64   AST 0 - 37 U/L 15   ALT 0 - 35 U/L 15    Edinburgh Score:    03/16/2022    2:00 AM  Edinburgh Postnatal Depression Scale Screening Tool  I have been able to laugh and see the funny side of things. 0  I have looked forward with enjoyment to things. 0  I have blamed myself unnecessarily when things went wrong. 0  I have been anxious or worried for no good reason. 0  I have felt scared or  panicky for no good reason. 0  Things have been getting on top of me. 0  I have been so unhappy that I have had difficulty sleeping. 0  I have felt sad or miserable. 0  I have been so unhappy that I have been crying. 0  The thought of harming myself has occurred to me. 0  Edinburgh Postnatal Depression Scale Total 0      After visit meds:  Allergies as of 03/17/2022   Not on File      Medication List     STOP taking these medications    doxylamine (Sleep) 25 MG tablet Commonly known as: UNISOM   Melatonin 5 MG Caps   metFORMIN 500 MG tablet Commonly known as: GLUCOPHAGE       TAKE these medications    ibuprofen 600 MG tablet Commonly known as: ADVIL Take 1 tablet (600 mg total) by mouth every 6 (six) hours as needed.   oxyCODONE 5 MG immediate release tablet Commonly known as: Roxicodone Take 1 tablet (5 mg total) by mouth every 4 (four) hours as needed for severe pain.   prenatal multivitamin Tabs tablet Take 1  tablet by mouth daily.               Discharge Care Instructions  (From admission, onward)           Start     Ordered   03/17/22 0000  Discharge wound care:       Comments: For a cesarean delivery: You may wash incision with soap and water.  Do not soak or submerge the incision for 2 weeks. Keep incision dry. You may need to keep a sanitary pad or panty liner between the incision and your clothing for comfort and to keep the incision dry. If you note drainage, increased pain, or increased redness of the incision, then please notify your physician.   03/17/22 1049   03/17/22 0000  If the dressing is still on your incision site when you go home, remove it on the third day after your surgery date. Remove dressing if it begins to fall off, or if it is dirty or damaged before the third day.       Comments: For a cesarean delivery   03/17/22 1049             Discharge home in stable condition Infant Feeding: Breast Infant Disposition:home with mother Discharge instruction: per After Visit Summary and Postpartum booklet. Activity: Advance as tolerated. Pelvic rest for 6 weeks.  Diet: routine diet Anticipated Birth Control: BTL done PP Postpartum Appointment:4 weeks Future Appointments:No future appointments. Follow up Visit:  Follow-up Information     Edwinna Areola, DO Follow up.   Specialty: Obstetrics and Gynecology Contact information: [address] [place] [address] [phone]                     03/17/2022 Waynard Reeds, MD

## 2022-03-24 ENCOUNTER — Telehealth (HOSPITAL_COMMUNITY): Payer: Self-pay | Admitting: *Deleted

## 2022-03-24 NOTE — Telephone Encounter (Signed)
Left phone voicemail message.  Duffy Rhody, RN 03-24-2022 at 12:35pm

## 2022-04-18 ENCOUNTER — Encounter: Payer: Self-pay | Admitting: Primary Care

## 2022-04-18 ENCOUNTER — Ambulatory Visit (INDEPENDENT_AMBULATORY_CARE_PROVIDER_SITE_OTHER): Payer: 59 | Admitting: Primary Care

## 2022-04-18 VITALS — BP 124/86 | HR 68 | Temp 97.3°F | Ht 60.5 in | Wt 146.4 lb

## 2022-04-18 DIAGNOSIS — Z0000 Encounter for general adult medical examination without abnormal findings: Secondary | ICD-10-CM | POA: Diagnosis not present

## 2022-04-18 DIAGNOSIS — Z8632 Personal history of gestational diabetes: Secondary | ICD-10-CM | POA: Insufficient documentation

## 2022-04-18 LAB — COMPREHENSIVE METABOLIC PANEL
ALT: 49 U/L — ABNORMAL HIGH (ref 0–35)
AST: 29 U/L (ref 0–37)
Albumin: 4.3 g/dL (ref 3.5–5.2)
Alkaline Phosphatase: 125 U/L — ABNORMAL HIGH (ref 39–117)
BUN: 11 mg/dL (ref 6–23)
CO2: 25 mEq/L (ref 19–32)
Calcium: 9.2 mg/dL (ref 8.4–10.5)
Chloride: 102 mEq/L (ref 96–112)
Creatinine, Ser: 0.52 mg/dL (ref 0.40–1.20)
GFR: 118.96 mL/min (ref >60.00)
Glucose, Bld: 84 mg/dL (ref 70–99)
Potassium: 4.1 mEq/L (ref 3.5–5.1)
Sodium: 137 mEq/L (ref 135–145)
Total Bilirubin: 0.6 mg/dL (ref 0.2–1.2)
Total Protein: 6.9 g/dL (ref 6.0–8.3)

## 2022-04-18 LAB — CBC
HCT: 41.4 % (ref 36.0–46.0)
Hemoglobin: 13.7 g/dL (ref 12.0–15.0)
MCHC: 33 g/dL (ref 30.0–36.0)
MCV: 90.1 fl (ref 78.0–100.0)
Platelets: 317 10*3/uL (ref 150.0–400.0)
RBC: 4.6 Mil/uL (ref 3.87–5.11)
RDW: 12.6 % (ref 11.5–15.5)
WBC: 8.2 10*3/uL (ref 4.0–10.5)

## 2022-04-18 LAB — LIPID PANEL
Cholesterol: 218 mg/dL — ABNORMAL HIGH (ref 0–200)
HDL: 52.1 mg/dL (ref >39.00)
LDL Cholesterol: 135 mg/dL — ABNORMAL HIGH (ref 0–99)
NonHDL: 165.95
Total CHOL/HDL Ratio: 4
Triglycerides: 155 mg/dL — ABNORMAL HIGH (ref 0.0–149.0)
VLDL: 31 mg/dL (ref 0.0–40.0)

## 2022-04-18 LAB — HEMOGLOBIN A1C: Hgb A1c MFr Bld: 5.5 % (ref 4.6–6.5)

## 2022-04-18 NOTE — Assessment & Plan Note (Signed)
Immunizations UTD. Pap smear UTD. Follows with GYN.  Discussed the importance of a healthy diet and regular exercise in order for weight loss, and to reduce the risk of further co-morbidity.  Exam stable. Labs pending.  Follow up in 1 year for repeat physical.  

## 2022-04-18 NOTE — Patient Instructions (Signed)
Stop by the lab prior to leaving today. I will notify you of your results once received.   It was a pleasure to see you today!  Preventive Care 21-37 Years Old, Female Preventive care refers to lifestyle choices and visits with your health care provider that can promote health and wellness. Preventive care visits are also called wellness exams. What can I expect for my preventive care visit? Counseling During your preventive care visit, your health care provider may ask about your: Medical history, including: Past medical problems. Family medical history. Pregnancy history. Current health, including: Menstrual cycle. Method of birth control. Emotional well-being. Home life and relationship well-being. Sexual activity and sexual health. Lifestyle, including: Alcohol, nicotine or tobacco, and drug use. Access to firearms. Diet, exercise, and sleep habits. Work and work environment. Sunscreen use. Safety issues such as seatbelt and bike helmet use. Physical exam Your health care provider may check your: Height and weight. These may be used to calculate your BMI (body mass index). BMI is a measurement that tells if you are at a healthy weight. Waist circumference. This measures the distance around your waistline. This measurement also tells if you are at a healthy weight and may help predict your risk of certain diseases, such as type 2 diabetes and high blood pressure. Heart rate and blood pressure. Body temperature. Skin for abnormal spots. What immunizations do I need?  Vaccines are usually given at various ages, according to a schedule. Your health care provider will recommend vaccines for you based on your age, medical history, and lifestyle or other factors, such as travel or where you work. What tests do I need? Screening Your health care provider may recommend screening tests for certain conditions. This may include: Pelvic exam and Pap test. Lipid and cholesterol  levels. Diabetes screening. This is done by checking your blood sugar (glucose) after you have not eaten for a while (fasting). Hepatitis B test. Hepatitis C test. HIV (human immunodeficiency virus) test. STI (sexually transmitted infection) testing, if you are at risk. BRCA-related cancer screening. This may be done if you have a family history of breast, ovarian, tubal, or peritoneal cancers. Talk with your health care provider about your test results, treatment options, and if necessary, the need for more tests. Follow these instructions at home: Eating and drinking  Eat a healthy diet that includes fresh fruits and vegetables, whole grains, lean protein, and low-fat dairy products. Take vitamin and mineral supplements as recommended by your health care provider. Do not drink alcohol if: Your health care provider tells you not to drink. You are pregnant, may be pregnant, or are planning to become pregnant. If you drink alcohol: Limit how much you have to 0-1 drink a day. Know how much alcohol is in your drink. In the [place] one drink equals one 12 oz bottle of beer (355 mL), one 5 oz glass of wine (148 mL), or one 1 oz glass of hard liquor (44 mL). Lifestyle Brush your teeth every morning and night with fluoride toothpaste. Floss one time each day. Exercise for at least 30 minutes 5 or more days each week. Do not use any products that contain nicotine or tobacco. These products include cigarettes, chewing tobacco, and vaping devices, such as e-cigarettes. If you need help quitting, ask your health care provider. Do not use drugs. If you are sexually active, practice safe sex. Use a condom or other form of protection to prevent STIs. If you do not wish to become pregnant, use a   form of birth control. If you plan to become pregnant, see your health care provider for a prepregnancy visit. Find healthy ways to manage stress, such as: Meditation, yoga, or listening to  music. Journaling. Talking to a trusted person. Spending time with friends and family. Minimize exposure to UV radiation to reduce your risk of skin cancer. Safety Always wear your seat belt while driving or riding in a vehicle. Do not drive: If you have been drinking alcohol. Do not ride with someone who has been drinking. If you have been using any mind-altering substances or drugs. While texting. When you are tired or distracted. Wear a helmet and other protective equipment during sports activities. If you have firearms in your house, make sure you follow all gun safety procedures. Seek help if you have been physically or sexually abused. What's next? Go to your health care provider once a year for an annual wellness visit. Ask your health care provider how often you should have your eyes and teeth checked. Stay up to date on all vaccines. This information is not intended to replace advice given to you by your health care provider. Make sure you discuss any questions you have with your health care provider. Document Revised: 03/23/2021 Document Reviewed: 03/23/2021 Elsevier Patient Education  [place].

## 2022-04-18 NOTE — Progress Notes (Signed)
Subjective:    Patient ID: Dawn Rivers, female    DOB: [birth year], 36 y.o.   MRN: [id number]  HPI  Dawn Rivers is a very pleasant 36 y.o. female who presents today for complete physical and follow up of chronic conditions.  Immunizations: -Tetanus: 2023 -Influenza: Completed last season -Covid-19: 2 vaccines  Diet: Fair diet.  Exercise: No regular exercise, some walking.   Eye exam: Completes annually  Dental exam: Completes semi-annually   Pap Smear: UTD, follows with GYN.    BP Readings from Last 3 Encounters:  04/18/22 124/86  03/17/22 130/74  06/24/20 118/62      Review of Systems  Constitutional:  Negative for unexpected weight change.  HENT:  Negative for rhinorrhea.   Respiratory:  Negative for cough and shortness of breath.   Cardiovascular:  Negative for chest pain.  Gastrointestinal:  Negative for constipation and diarrhea.  Genitourinary:  Negative for difficulty urinating and menstrual problem.  Musculoskeletal:  Negative for arthralgias and myalgias.  Skin:  Negative for rash.  Allergic/Immunologic: Negative for environmental allergies.  Neurological:  Negative for dizziness and headaches.  Psychiatric/Behavioral:  The patient is not nervous/anxious.          Past Medical History:  Diagnosis Date   Acute neck pain 06/24/2019   Chicken pox    Elevated LFTs 01/24/2016   Gestational diabetes    Positive TB test    Urinary tract infection     Social History   Socioeconomic History   Marital status: Married    Spouse name: Not on file   Number of children: Not on file   Years of education: Not on file   Highest education level: Not on file  Occupational History   Not on file  Tobacco Use   Smoking status: Some Days    Types: Cigarettes   Smokeless tobacco: Never   Tobacco comments:    2 to 3 in a few days  Vaping Use   Vaping Use: Never used  Substance and Sexual Activity   Alcohol use: No    Alcohol/week: 0.0 standard  drinks of alcohol   Drug use: Never   Sexual activity: Yes  Other Topics Concern   Not on file  Social History Narrative   Married.   1 child.   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   Spending time with her family, running, reading.    Social Determinants of Health   Financial Resource Strain: Not on file  Food Insecurity: Not on file  Transportation Needs: Not on file  Physical Activity: Not on file  Stress: Not on file  Social Connections: Not on file  Intimate Partner Violence: Not on file    Past Surgical History:  Procedure Laterality Date   CESAREAN SECTION     CESAREAN SECTION WITH BILATERAL TUBAL LIGATION Bilateral 03/15/2022   Procedure: CESAREAN SECTION WITH BILATERAL TUBAL LIGATION;  Surgeon: Edwinna Areola, DO;  Location: [facility] ORS;  Service: Obstetrics;  Laterality: Bilateral;  request RNFA or 2nd scrub tech    Family History  Problem Relation Age of Onset   Hypertension Mother    Hypertension Father    Diabetes Father    Hyperlipidemia Father    Sleep apnea Father    GER disease Father    Hypertension Brother     No Known Allergies  Current Outpatient Medications on File Prior to Visit  Medication Sig Dispense Refill   Prenatal Vit-Fe Fumarate-FA (PRENATAL MULTIVITAMIN) TABS tablet Take 1  tablet by mouth daily.     No current facility-administered medications on file prior to visit.    BP 124/86   Pulse 68   Temp (!) 97.3 F (36.3 C) (Temporal)   Ht 5' 0.5" (1.537 m)   Wt 146 lb 6 oz (66.4 kg)   SpO2 98%   Breastfeeding Yes   BMI 28.12 kg/m  Objective:   Physical Exam HENT:     Right Ear: Tympanic membrane and ear canal normal.     Left Ear: Tympanic membrane and ear canal normal.     Nose: Nose normal.  Eyes:     Conjunctiva/sclera: Conjunctivae normal.     Pupils: Pupils are equal, round, and reactive to light.  Neck:     Thyroid: No thyromegaly.  Cardiovascular:     Rate and Rhythm: Normal rate and regular rhythm.     Heart  sounds: No murmur heard. Pulmonary:     Effort: Pulmonary effort is normal.     Breath sounds: Normal breath sounds. No rales.  Abdominal:     General: Bowel sounds are normal.     Palpations: Abdomen is soft.     Tenderness: There is no abdominal tenderness.  Musculoskeletal:        General: Normal range of motion.     Cervical back: Neck supple.  Lymphadenopathy:     Cervical: No cervical adenopathy.  Skin:    General: Skin is warm and dry.     Findings: No rash.  Neurological:     Mental Status: She is alert and oriented to person, place, and time.     Cranial Nerves: No cranial nerve deficit.     Deep Tendon Reflexes: Reflexes are normal and symmetric.  Psychiatric:        Mood and Affect: Mood normal.           Assessment & Plan:   Problem List Items Addressed This Visit       Other   Preventative health care - Primary    Immunizations UTD. Pap smear UTD. Follows with GYN  Discussed the importance of a healthy diet and regular exercise in order for weight loss, and to reduce the risk of further co-morbidity.  Exam stable. Labs pending.  Follow up in 1 year for repeat physical.       Relevant Orders   Lipid panel   Hemoglobin A1c   Comprehensive metabolic panel   CBC       Doreene Nest, NP

## 2022-04-18 NOTE — Assessment & Plan Note (Signed)
No longer on metformin.  Checking A1C today.

## 2022-04-20 ENCOUNTER — Encounter: Payer: Self-pay | Admitting: Primary Care

## 2022-04-25 DIAGNOSIS — Z1389 Encounter for screening for other disorder: Secondary | ICD-10-CM | POA: Diagnosis not present

## 2022-04-25 DIAGNOSIS — Z3009 Encounter for other general counseling and advice on contraception: Secondary | ICD-10-CM | POA: Diagnosis not present

## 2022-07-13 DIAGNOSIS — Z1389 Encounter for screening for other disorder: Secondary | ICD-10-CM | POA: Diagnosis not present

## 2022-07-13 DIAGNOSIS — Z01419 Encounter for gynecological examination (general) (routine) without abnormal findings: Secondary | ICD-10-CM | POA: Diagnosis not present

## 2022-07-13 DIAGNOSIS — Z130 Encounter for screening for diseases of the blood and blood-forming organs and certain disorders involving the immune mechanism: Secondary | ICD-10-CM | POA: Diagnosis not present

## 2024-01-01 ENCOUNTER — Encounter: Payer: Self-pay | Admitting: Primary Care

## 2024-01-01 ENCOUNTER — Ambulatory Visit (INDEPENDENT_AMBULATORY_CARE_PROVIDER_SITE_OTHER): Payer: Self-pay | Admitting: Primary Care

## 2024-01-01 VITALS — BP 126/70 | HR 70 | Temp 97.9°F | Ht 60.5 in | Wt 127.0 lb

## 2024-01-01 DIAGNOSIS — K219 Gastro-esophageal reflux disease without esophagitis: Secondary | ICD-10-CM | POA: Insufficient documentation

## 2024-01-01 DIAGNOSIS — E785 Hyperlipidemia, unspecified: Secondary | ICD-10-CM | POA: Insufficient documentation

## 2024-01-01 DIAGNOSIS — Z0000 Encounter for general adult medical examination without abnormal findings: Secondary | ICD-10-CM | POA: Diagnosis not present

## 2024-01-01 LAB — COMPREHENSIVE METABOLIC PANEL
ALT: 30 U/L (ref 0–35)
AST: 17 U/L (ref 0–37)
Albumin: 4.4 g/dL (ref 3.5–5.2)
Alkaline Phosphatase: 65 U/L (ref 39–117)
BUN: 11 mg/dL (ref 6–23)
CO2: 27 meq/L (ref 19–32)
Calcium: 8.9 mg/dL (ref 8.4–10.5)
Chloride: 104 meq/L (ref 96–112)
Creatinine, Ser: 0.52 mg/dL (ref 0.40–1.20)
GFR: 117.55 mL/min (ref >60.00)
Glucose, Bld: 90 mg/dL (ref 70–99)
Potassium: 3.8 meq/L (ref 3.5–5.1)
Sodium: 137 meq/L (ref 135–145)
Total Bilirubin: 0.7 mg/dL (ref 0.2–1.2)
Total Protein: 7 g/dL (ref 6.0–8.3)

## 2024-01-01 LAB — CBC
HCT: 40.5 % (ref 36.0–46.0)
Hemoglobin: 13.7 g/dL (ref 12.0–15.0)
MCHC: 33.7 g/dL (ref 30.0–36.0)
MCV: 91 fl (ref 78.0–100.0)
Platelets: 290 10*3/uL (ref 150.0–400.0)
RBC: 4.45 Mil/uL (ref 3.87–5.11)
RDW: 12.2 % (ref 11.5–15.5)
WBC: 7.7 10*3/uL (ref 4.0–10.5)

## 2024-01-01 LAB — LIPID PANEL
Cholesterol: 172 mg/dL (ref 0–200)
HDL: 46.4 mg/dL (ref >39.00)
LDL Cholesterol: 113 mg/dL — ABNORMAL HIGH (ref 0–99)
NonHDL: 125.94
Total CHOL/HDL Ratio: 4
Triglycerides: 64 mg/dL (ref 0.0–149.0)
VLDL: 12.8 mg/dL (ref 0.0–40.0)

## 2024-01-01 LAB — HEMOGLOBIN A1C: Hgb A1c MFr Bld: 4.9 % (ref 4.6–6.5)

## 2024-01-01 MED ORDER — FAMOTIDINE 20 MG PO TABS: 20.0000 mg | ORAL_TABLET | Freq: Every day | ORAL | 0 refills | Status: DC

## 2024-01-01 NOTE — Assessment & Plan Note (Signed)
 Discussed common triggers for GERD. We also discussed to avoid laying flat within 2 hours of eating.  Start famotidine 20 mg at bedtime. She will update.

## 2024-01-01 NOTE — Assessment & Plan Note (Signed)
 Immunizations UTD. Pap smear UTD.  Follows with GYN  Discussed the importance of a healthy diet and regular exercise in order for weight loss, and to reduce the risk of further co-morbidity.  Exam stable. Labs pending.  Follow up in 1 year for repeat physical.

## 2024-01-01 NOTE — Patient Instructions (Signed)
 Stop by the lab prior to leaving today. I will notify you of your results once received.   Try taking famotidine 20 mg at bedtime for heartburn.  Avoid the trigger foods and drinks for heartburn.  Avoid laying flat within 2 hours of eating.  It was a pleasure to see you today!

## 2024-01-01 NOTE — Progress Notes (Signed)
 Subjective:    Patient ID: Dawn Rivers, female    DOB: Feb 20, 1986, 38 y.o.   MRN: [id number]  HPI  Dawn Rivers is a very pleasant 38 y.o. female who presents today for complete physical and follow up of chronic conditions.  She would also like to discuss acid reflux. Each time she wakes in the morning she experiences a sore throat. She will take Tums at times which does help with symptoms. She does experience belching and esophageal burning intermittently during the day. She goes to bed within 2 hours of eating as she works night shift. She has noticed increased symptoms with tomato based products and coffee.   Immunizations: -Tetanus: Completed in 2023 -Influenza: Completed this season   Diet: Fair diet.  Exercise: Regular exercise at work  Eye exam: Completes 2 years ago   Dental exam: Completes semi-annually    Pap Smear: Completed in October 2021, follows with GYN  BP Readings from Last 3 Encounters:  01/01/24 126/70  04/18/22 124/86  03/17/22 130/74   Wt Readings from Last 3 Encounters:  01/01/24 127 lb (57.6 kg)  04/18/22 146 lb 6 oz (66.4 kg)  03/15/22 162 lb (73.5 kg)       Review of Systems  Constitutional:  Negative for unexpected weight change.  HENT:  Negative for rhinorrhea.   Respiratory:  Negative for cough and shortness of breath.   Cardiovascular:  Negative for chest pain.  Gastrointestinal:  Negative for constipation and diarrhea.  Genitourinary:  Negative for difficulty urinating and menstrual problem.  Musculoskeletal:  Negative for arthralgias and myalgias.  Skin:  Negative for rash.  Allergic/Immunologic: Negative for environmental allergies.  Neurological:  Negative for dizziness, numbness and headaches.  Psychiatric/Behavioral:  The patient is not nervous/anxious.          Past Medical History:  Diagnosis Date   Acute neck pain 06/24/2019   Chicken pox    Elevated LFTs 01/24/2016   Gestational diabetes    Positive TB test     Postpartum care following cesarean delivery 03/15/2022   Urinary tract infection     Social History   Socioeconomic History   Marital status: Married    Spouse name: Not on file   Number of children: Not on file   Years of education: Not on file   Highest education level: Not on file  Occupational History   Not on file  Tobacco Use   Smoking status: Some Days    Types: Cigarettes   Smokeless tobacco: Never   Tobacco comments:    2 to 3 in a few days  Vaping Use   Vaping status: Never Used  Substance and Sexual Activity   Alcohol use: No    Alcohol/week: 0.0 standard drinks of alcohol   Drug use: Never   Sexual activity: Yes  Other Topics Concern   Not on file  Social History Narrative   Married.   1 child.   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   Spending time with her family, running, reading.    Social Drivers of Corporate investment banker Strain: Not on file  Food Insecurity: Not on file  Transportation Needs: Not on file  Physical Activity: Not on file  Stress: Not on file  Social Connections: Not on file  Intimate Partner Violence: Not on file    Past Surgical History:  Procedure Laterality Date   CESAREAN SECTION     CESAREAN SECTION WITH BILATERAL TUBAL LIGATION Bilateral 03/15/2022  Procedure: CESAREAN SECTION WITH BILATERAL TUBAL LIGATION;  Surgeon: Edwinna Areola, DO;  Location: [facility];  Service: Obstetrics;  Laterality: Bilateral;  request RNFA or 2nd scrub tech    Family History  Problem Relation Age of Onset   Hypertension Mother    Hypertension Father    Diabetes Father    Hyperlipidemia Father    Sleep apnea Father    GER disease Father    Hypertension Brother     No Known Allergies  Current Outpatient Medications on File Prior to Visit  Medication Sig Dispense Refill   Prenatal Vit-Fe Fumarate-FA (PRENATAL MULTIVITAMIN) TABS tablet Take 1 tablet by mouth daily. (Patient not taking: Reported on 01/01/2024)     No current  facility-administered medications on file prior to visit.    BP 126/70   Pulse 70   Temp 97.9 F (36.6 C) (Temporal)   Ht 5' 0.5" (1.537 m)   Wt 127 lb (57.6 kg)   LMP 12/31/2023 (Exact Date)   SpO2 99%   Breastfeeding No   BMI 24.39 kg/m  Objective:   Physical Exam HENT:     Right Ear: Tympanic membrane and ear canal normal.     Left Ear: Tympanic membrane and ear canal normal.  Eyes:     Pupils: Pupils are equal, round, and reactive to light.  Cardiovascular:     Rate and Rhythm: Normal rate and regular rhythm.  Pulmonary:     Effort: Pulmonary effort is normal.     Breath sounds: Normal breath sounds.  Abdominal:     General: Bowel sounds are normal.     Palpations: Abdomen is soft.     Tenderness: There is no abdominal tenderness.  Musculoskeletal:        General: Normal range of motion.     Cervical back: Neck supple.  Skin:    General: Skin is warm and dry.  Neurological:     Mental Status: She is alert and oriented to person, place, and time.     Cranial Nerves: No cranial nerve deficit.     Deep Tendon Reflexes:     Reflex Scores:      Patellar reflexes are 2+ on the right side and 2+ on the left side. Psychiatric:        Mood and Affect: Mood normal.           Assessment & Plan:  Preventative health care Assessment & Plan: Immunizations UTD. Pap smear UTD.  Follows with GYN  Discussed the importance of a healthy diet and regular exercise in order for weight loss, and to reduce the risk of further co-morbidity.  Exam stable. Labs pending.  Follow up in 1 year for repeat physical.    Hyperlipidemia, unspecified hyperlipidemia type Assessment & Plan: Repeat lipid panel pending. Commended her on weight loss.  Orders: -     Lipid panel -     Comprehensive metabolic panel -     Hemoglobin A1c -     CBC  Gastroesophageal reflux disease, unspecified whether esophagitis present Assessment & Plan: Discussed common triggers for GERD. We  also discussed to avoid laying flat within 2 hours of eating.  Start famotidine 20 mg at bedtime. She will update.         Dawn Nest, NP

## 2024-01-01 NOTE — Assessment & Plan Note (Addendum)
Repeat lipid panel pending.  Commended her on weight loss! 

## 2024-02-13 DIAGNOSIS — Z01419 Encounter for gynecological examination (general) (routine) without abnormal findings: Secondary | ICD-10-CM | POA: Diagnosis not present

## 2024-02-13 DIAGNOSIS — L7682 Other postprocedural complications of skin and subcutaneous tissue: Secondary | ICD-10-CM | POA: Diagnosis not present

## 2024-02-13 DIAGNOSIS — Z130 Encounter for screening for diseases of the blood and blood-forming organs and certain disorders involving the immune mechanism: Secondary | ICD-10-CM | POA: Diagnosis not present

## 2024-02-13 DIAGNOSIS — Z1389 Encounter for screening for other disorder: Secondary | ICD-10-CM | POA: Diagnosis not present

## 2024-04-22 ENCOUNTER — Encounter (HOSPITAL_COMMUNITY): Payer: Self-pay | Admitting: Obstetrics and Gynecology

## 2024-04-22 NOTE — Progress Notes (Signed)
 Spoke w/ via phone for pre-op interview--- Dawn Rivers needs dos---- UPT per anesthesia. Surgeon orders requested 04/22/24.        Rivers results------ COVID test -----patient states asymptomatic no test needed Arrive at -------0830 NPO after MN NO Solid Food.  Clear liquids from MN until---0730 Pre-Surgery Ensure or G2:  Med rec completed Medications to take morning of surgery -----Pepcid  Diabetic medication -----  GLP1 agonist last dose: GLP1 instructions:  Patient instructed no nail polish to be worn day of surgery Patient instructed to bring photo id and insurance card day of surgery Patient aware to have Driver (ride ) / caregiver    for 24 hours after surgery - Husband Dawn Rivers Patient Special Instructions ----- Shower with antibacterial soap. Pre-Op special Instructions -----  Patient verbalized understanding of instructions that were given at this phone interview. Patient denies chest pain, sob, fever, cough at the interview.

## 2024-04-27 NOTE — Anesthesia Preprocedure Evaluation (Signed)
 Anesthesia Evaluation  Patient identified by MRN, date of birth, ID band Patient awake    Reviewed: Allergy & Precautions, H&P , NPO status , Patient's Chart, lab work & pertinent test results  History of Anesthesia Complications Negative for: history of anesthetic complications  Airway Mallampati: II  TM Distance: >3 FB     Dental   Pulmonary neg pulmonary ROS, Current Smoker   Pulmonary exam normal        Cardiovascular negative cardio ROS  Rhythm:regular Rate:Normal     Neuro/Psych negative neurological ROS  negative psych ROS   GI/Hepatic Neg liver ROS,GERD  Medicated,,  Endo/Other    Renal/GU negative Renal ROS  negative genitourinary   Musculoskeletal   Abdominal   Peds  Hematology negative hematology ROS (+)   Anesthesia Other Findings   Reproductive/Obstetrics G2P1001 at [redacted]w[redacted]d Prior C/S x1                              Anesthesia Physical Anesthesia Plan  ASA: 2  Anesthesia Plan: General   Post-op Pain Management: Tylenol  PO (pre-op)* and Celebrex  PO (pre-op)*   Induction: Intravenous  PONV Risk Score and Plan: Ondansetron , Treatment may vary due to age or medical condition, TIVA and Dexamethasone   Airway Management Planned: LMA  Additional Equipment:   Intra-op Plan:   Post-operative Plan:   Informed Consent: I have reviewed the patients History and Physical, chart, labs and discussed the procedure including the risks, benefits and alternatives for the proposed anesthesia with the patient or authorized representative who has indicated his/her understanding and acceptance.       Plan Discussed with: Anesthesiologist  Anesthesia Plan Comments: ( )         Anesthesia Quick Evaluation

## 2024-04-28 ENCOUNTER — Encounter (HOSPITAL_COMMUNITY)
Admission: RE | Disposition: A | Discharge status: Home / Self Care | Payer: Self-pay | Source: Home / Self Care | Attending: Obstetrics and Gynecology

## 2024-04-28 ENCOUNTER — Ambulatory Visit (HOSPITAL_COMMUNITY): Payer: Self-pay | Admitting: Anesthesiology

## 2024-04-28 ENCOUNTER — Other Ambulatory Visit: Payer: Self-pay

## 2024-04-28 ENCOUNTER — Ambulatory Visit (HOSPITAL_BASED_OUTPATIENT_CLINIC_OR_DEPARTMENT_OTHER): Payer: Self-pay | Admitting: Anesthesiology

## 2024-04-28 ENCOUNTER — Encounter (HOSPITAL_COMMUNITY): Payer: Self-pay | Admitting: Obstetrics and Gynecology

## 2024-04-28 ENCOUNTER — Ambulatory Visit (HOSPITAL_COMMUNITY)
Admission: RE | Admit: 2024-04-28 | Discharge: 2024-04-28 | Disposition: A | Discharge status: Home / Self Care | Attending: Obstetrics and Gynecology | Admitting: Obstetrics and Gynecology

## 2024-04-28 DIAGNOSIS — F17210 Nicotine dependence, cigarettes, uncomplicated: Secondary | ICD-10-CM | POA: Diagnosis not present

## 2024-04-28 DIAGNOSIS — L905 Scar conditions and fibrosis of skin: Secondary | ICD-10-CM | POA: Diagnosis not present

## 2024-04-28 DIAGNOSIS — E785 Hyperlipidemia, unspecified: Secondary | ICD-10-CM | POA: Diagnosis not present

## 2024-04-28 DIAGNOSIS — L7682 Other postprocedural complications of skin and subcutaneous tissue: Secondary | ICD-10-CM | POA: Diagnosis not present

## 2024-04-28 DIAGNOSIS — E119 Type 2 diabetes mellitus without complications: Secondary | ICD-10-CM | POA: Insufficient documentation

## 2024-04-28 DIAGNOSIS — K219 Gastro-esophageal reflux disease without esophagitis: Secondary | ICD-10-CM | POA: Diagnosis not present

## 2024-04-28 DIAGNOSIS — R109 Unspecified abdominal pain: Secondary | ICD-10-CM | POA: Insufficient documentation

## 2024-04-28 DIAGNOSIS — Z01818 Encounter for other preprocedural examination: Secondary | ICD-10-CM

## 2024-04-28 HISTORY — DX: Other specified postprocedural states: Z98.890

## 2024-04-28 HISTORY — DX: Gastro-esophageal reflux disease without esophagitis: K21.9

## 2024-04-28 HISTORY — PX: SCAR REVISION: SHX5285

## 2024-04-28 LAB — CBC
HCT: 42.7 % (ref 36.0–46.0)
Hemoglobin: 14.1 g/dL (ref 12.0–15.0)
MCH: 31.7 pg (ref 26.0–34.0)
MCHC: 33 g/dL (ref 30.0–36.0)
MCV: 96 fL (ref 80.0–100.0)
Platelets: 291 K/uL (ref 150–400)
RBC: 4.45 MIL/uL (ref 3.87–5.11)
RDW: 12.1 % (ref 11.5–15.5)
WBC: 8.7 K/uL (ref 4.0–10.5)
nRBC: 0 % (ref 0.0–0.2)

## 2024-04-28 LAB — TYPE AND SCREEN
ABO/RH(D): O POS
Antibody Screen: NEGATIVE

## 2024-04-28 LAB — POCT PREGNANCY, URINE: Preg Test, Ur: NEGATIVE

## 2024-04-28 SURGERY — REVISION, SCAR
Anesthesia: Monitor Anesthesia Care | Site: Abdomen

## 2024-04-28 MED ORDER — LACTATED RINGERS IV SOLN: INTRAVENOUS | Status: DC

## 2024-04-28 MED ORDER — BUPIVACAINE HCL (PF) 0.25 % IJ SOLN
INTRAMUSCULAR | Status: DC | PRN
  Administered 2024-04-28: 30 mL

## 2024-04-28 MED ORDER — DEXAMETHASONE SODIUM PHOSPHATE 10 MG/ML IJ SOLN
INTRAMUSCULAR | Status: AC
  Filled 2024-04-28: qty 1

## 2024-04-28 MED ORDER — BUPIVACAINE HCL (PF) 0.25 % IJ SOLN
INTRAMUSCULAR | Status: AC
Start: 2024-04-28 — End: 2024-04-28
  Filled 2024-04-28: qty 30

## 2024-04-28 MED ORDER — PHENYLEPHRINE 80 MCG/ML (10ML) SYRINGE FOR IV PUSH (FOR BLOOD PRESSURE SUPPORT)
PREFILLED_SYRINGE | INTRAVENOUS | Status: DC | PRN
  Administered 2024-04-28: 40 ug via INTRAVENOUS
  Administered 2024-04-28: 80 ug via INTRAVENOUS

## 2024-04-28 MED ORDER — SODIUM CHLORIDE (PF) 0.9 % IJ SOLN
INTRAMUSCULAR | Status: AC
  Filled 2024-04-28: qty 10

## 2024-04-28 MED ORDER — ONDANSETRON HCL 4 MG/2ML IJ SOLN
INTRAMUSCULAR | Status: DC | PRN
Start: 2024-04-28 — End: 2024-04-28
  Administered 2024-04-28: 4 mg via INTRAVENOUS

## 2024-04-28 MED ORDER — MEPERIDINE HCL 25 MG/ML IJ SOLN: 6.2500 mg | INTRAMUSCULAR | Status: DC | PRN

## 2024-04-28 MED ORDER — DEXAMETHASONE SODIUM PHOSPHATE 10 MG/ML IJ SOLN
INTRAMUSCULAR | Status: DC | PRN
  Administered 2024-04-28: 5 mg via INTRAVENOUS

## 2024-04-28 MED ORDER — IBUPROFEN 600 MG PO TABS: 600.0000 mg | ORAL_TABLET | Freq: Four times a day (QID) | ORAL | 1 refills | Status: AC | PRN

## 2024-04-28 MED ORDER — MIDAZOLAM HCL 2 MG/2ML IJ SOLN
INTRAMUSCULAR | Status: AC
  Filled 2024-04-28: qty 2

## 2024-04-28 MED ORDER — CELECOXIB 200 MG PO CAPS
200.0000 mg | ORAL_CAPSULE | Freq: Once | ORAL | Status: AC
  Administered 2024-04-28: 200 mg via ORAL

## 2024-04-28 MED ORDER — ACETAMINOPHEN 500 MG PO TABS
ORAL_TABLET | ORAL | Status: AC
  Filled 2024-04-28: qty 2

## 2024-04-28 MED ORDER — ACETAMINOPHEN 500 MG PO TABS
1000.0000 mg | ORAL_TABLET | ORAL | Status: AC
  Administered 2024-04-28: 1000 mg via ORAL

## 2024-04-28 MED ORDER — FENTANYL CITRATE (PF) 100 MCG/2ML IJ SOLN: 25.0000 ug | INTRAMUSCULAR | Status: DC | PRN

## 2024-04-28 MED ORDER — ONDANSETRON 4 MG PO TBDP: 4.0000 mg | ORAL_TABLET | Freq: Three times a day (TID) | ORAL | 0 refills | Status: AC | PRN

## 2024-04-28 MED ORDER — PHENYLEPHRINE 80 MCG/ML (10ML) SYRINGE FOR IV PUSH (FOR BLOOD PRESSURE SUPPORT)
PREFILLED_SYRINGE | INTRAVENOUS | Status: AC
  Filled 2024-04-28: qty 10

## 2024-04-28 MED ORDER — TRIAMCINOLONE ACETONIDE 40 MG/ML IJ SUSP
INTRAMUSCULAR | Status: DC | PRN
  Administered 2024-04-28: 200 mg via INTRAMUSCULAR

## 2024-04-28 MED ORDER — MIDAZOLAM HCL 2 MG/2ML IJ SOLN
INTRAMUSCULAR | Status: DC | PRN
  Administered 2024-04-28: 2 mg via INTRAVENOUS

## 2024-04-28 MED ORDER — FENTANYL CITRATE (PF) 250 MCG/5ML IJ SOLN
INTRAMUSCULAR | Status: DC | PRN
  Administered 2024-04-28: 50 ug via INTRAVENOUS

## 2024-04-28 MED ORDER — POVIDONE-IODINE 10 % EX SWAB: 2.0000 | Freq: Once | CUTANEOUS | Status: DC

## 2024-04-28 MED ORDER — OXYCODONE HCL 5 MG/5ML PO SOLN: 5.0000 mg | Freq: Once | ORAL | Status: DC | PRN

## 2024-04-28 MED ORDER — ONDANSETRON HCL 4 MG/2ML IJ SOLN: 4.0000 mg | Freq: Once | INTRAMUSCULAR | Status: DC | PRN

## 2024-04-28 MED ORDER — TRIAMCINOLONE ACETONIDE 40 MG/ML IJ SUSP
INTRAMUSCULAR | Status: AC
  Filled 2024-04-28: qty 5

## 2024-04-28 MED ORDER — OXYCODONE-ACETAMINOPHEN 5-325 MG PO TABS: 1.0000 | ORAL_TABLET | ORAL | 0 refills | Status: AC | PRN

## 2024-04-28 MED ORDER — ONDANSETRON HCL 4 MG/2ML IJ SOLN
INTRAMUSCULAR | Status: AC
  Filled 2024-04-28: qty 2

## 2024-04-28 MED ORDER — ORAL CARE MOUTH RINSE: 15.0000 mL | Freq: Once | OROMUCOSAL | Status: AC

## 2024-04-28 MED ORDER — OXYCODONE HCL 5 MG PO TABS: 5.0000 mg | ORAL_TABLET | Freq: Once | ORAL | Status: DC | PRN

## 2024-04-28 MED ORDER — KETOROLAC TROMETHAMINE 30 MG/ML IJ SOLN: 30.0000 mg | Freq: Once | INTRAMUSCULAR | Status: DC

## 2024-04-28 MED ORDER — CELECOXIB 200 MG PO CAPS
ORAL_CAPSULE | ORAL | Status: AC
  Filled 2024-04-28: qty 1

## 2024-04-28 MED ORDER — CHLORHEXIDINE GLUCONATE 0.12 % MT SOLN
15.0000 mL | Freq: Once | OROMUCOSAL | Status: AC
  Administered 2024-04-28: 15 mL via OROMUCOSAL

## 2024-04-28 MED ORDER — CHLORHEXIDINE GLUCONATE 0.12 % MT SOLN
OROMUCOSAL | Status: AC
  Filled 2024-04-28: qty 15

## 2024-04-28 MED ORDER — 0.9 % SODIUM CHLORIDE (POUR BTL) OPTIME
TOPICAL | Status: DC | PRN
  Administered 2024-04-28: 1000 mL

## 2024-04-28 MED ORDER — LIDOCAINE 2% (20 MG/ML) 5 ML SYRINGE
INTRAMUSCULAR | Status: AC
  Filled 2024-04-28: qty 5

## 2024-04-28 MED ORDER — SODIUM CHLORIDE (PF) 0.9 % IJ SOLN
INTRAMUSCULAR | Status: DC | PRN
  Administered 2024-04-28: 30 mL

## 2024-04-28 MED ORDER — PROPOFOL 500 MG/50ML IV EMUL
INTRAVENOUS | Status: DC | PRN
  Administered 2024-04-28: 175 ug/kg/min via INTRAVENOUS
  Administered 2024-04-28: 60 mg via INTRAVENOUS

## 2024-04-28 MED ORDER — SODIUM CHLORIDE (PF) 0.9 % IJ SOLN
INTRAMUSCULAR | Status: AC
  Filled 2024-04-28: qty 50

## 2024-04-28 MED ORDER — LIDOCAINE 2% (20 MG/ML) 5 ML SYRINGE
INTRAMUSCULAR | Status: DC | PRN
  Administered 2024-04-28: 100 mg via INTRAVENOUS

## 2024-04-28 MED ORDER — FENTANYL CITRATE (PF) 250 MCG/5ML IJ SOLN
INTRAMUSCULAR | Status: AC
  Filled 2024-04-28: qty 5

## 2024-04-28 SURGICAL SUPPLY — 32 items
BENZOIN TINCTURE PRP APPL 2/3 (GAUZE/BANDAGES/DRESSINGS) IMPLANT
CHLORAPREP W/TINT 26 (MISCELLANEOUS) ×1 IMPLANT
COVER MAYO STAND STRL (DRAPES) ×1 IMPLANT
DRAPE WARM FLUID 44X44 (DRAPES) ×1 IMPLANT
DRSG OPSITE POSTOP 4X10 (GAUZE/BANDAGES/DRESSINGS) ×1 IMPLANT
ELECT NDL TIP 2.8 STRL (NEEDLE) ×1 IMPLANT
ELECT NEEDLE TIP 2.8 STRL (NEEDLE) IMPLANT
GAUZE 4X4 16PLY ~~LOC~~+RFID DBL (SPONGE) ×1 IMPLANT
GLOVE BIO SURGEON STRL SZ 6.5 (GLOVE) ×1 IMPLANT
GLOVE BIOGEL PI IND STRL 7.0 (GLOVE) ×3 IMPLANT
GOWN STRL REUS W/ TWL LRG LVL3 (GOWN DISPOSABLE) ×3 IMPLANT
KIT TURNOVER KIT B (KITS) ×1 IMPLANT
MANIFOLD NEPTUNE II (INSTRUMENTS) ×1 IMPLANT
NDL HYPO 22X1.5 SAFETY MO (MISCELLANEOUS) ×1 IMPLANT
NEEDLE HYPO 22X1.5 SAFETY MO (MISCELLANEOUS) ×1 IMPLANT
NS IRRIG 1000ML POUR BTL (IV SOLUTION) ×1 IMPLANT
PACK ABDOMINAL GYN (CUSTOM PROCEDURE TRAY) ×1 IMPLANT
PAD ARMBOARD POSITIONER FOAM (MISCELLANEOUS) ×1 IMPLANT
SPIKE FLUID TRANSFER (MISCELLANEOUS) ×1 IMPLANT
SPONGE T-LAP 18X18 ~~LOC~~+RFID (SPONGE) IMPLANT
STRIP CLOSURE SKIN 1/2X4 (GAUZE/BANDAGES/DRESSINGS) IMPLANT
SUT MON AB 2-0 CT1 36 (SUTURE) ×1 IMPLANT
SUT MON AB 3-0 SH27 (SUTURE) ×1 IMPLANT
SUT VIC AB 0 CT1 18XCR BRD8 (SUTURE) IMPLANT
SUT VIC AB 0 CT1 36 (SUTURE) IMPLANT
SUT VIC AB 2-0 CT1 TAPERPNT 27 (SUTURE) IMPLANT
SUT VIC AB 2-0 UR6 27 (SUTURE) IMPLANT
SUT VIC AB 4-0 KS 27 (SUTURE) ×1 IMPLANT
SUT VIC AB 4-0 SH 27XBRD (SUTURE) IMPLANT
SYR CONTROL 10ML LL (SYRINGE) ×2 IMPLANT
TOWEL GREEN STERILE FF (TOWEL DISPOSABLE) ×2 IMPLANT
TRAY FOLEY W/BAG SLVR 14FR (SET/KITS/TRAYS/PACK) ×1 IMPLANT

## 2024-04-28 NOTE — Anesthesia Procedure Notes (Signed)
 Procedure Name: MAC Date/Time: 04/28/2024 10:12 AM  Performed by: Viviana Almarie DASEN, CRNAPre-anesthesia Checklist: Patient identified, Emergency Drugs available, Suction available, Timeout performed and Patient being monitored Patient Re-evaluated:Patient Re-evaluated prior to induction Oxygen Delivery Method: Nasal cannula Induction Type: IV induction

## 2024-04-28 NOTE — Discharge Instructions (Signed)
 DO NOT TAKE TYLENOL  UNTIL AFTER 2:30PM  Post Anesthesia Home Care Instructions  Activity: Get plenty of rest for the remainder of the day. A responsible adult should stay with you for 24 hours following the procedure.  For the next 24 hours, DO NOT: -Drive a car -Advertising copywriter -Drink alcoholic beverages -Take any medication unless instructed by your physician -Make any legal decisions or sign important papers.  Meals: Start with liquid foods such as gelatin or soup. Progress to regular foods as tolerated. Avoid greasy, spicy, heavy foods. If nausea and/or vomiting occur, drink only clear liquids until the nausea and/or vomiting subsides. Call your physician if vomiting continues.  Special Instructions/Symptoms: Your throat may feel dry or sore from the anesthesia or the breathing tube placed in your throat during surgery. If this causes discomfort, gargle with warm salt water . The discomfort should disappear within 24 hours.  If you had a scopolamine  patch placed behind your ear for the management of post- operative nausea and/or vomiting:  1. The medication in the patch is effective for 72 hours, after which it should be removed.  Wrap patch in a tissue and discard in the trash. Wash hands thoroughly with soap and water . 2. You may remove the patch earlier than 72 hours if you experience unpleasant side effects which may include dry mouth, dizziness or visual disturbances. 3. Avoid touching the patch. Wash your hands with soap and water  after contact with the patch.  Call your surgeon if you experience:   1.  Fever over 101.0. 2.  Inability to urinate. 3.  Nausea and/or vomiting. 4.  Extreme swelling or bruising at the surgical site. 5.  Continued bleeding from the incision. 6.  Increased pain, redness or drainage from the incision. 7.  Problems related to your pain medication. 8. Any change in color, movement and/or sensation 9. Any problems and/or concerns

## 2024-04-28 NOTE — Anesthesia Procedure Notes (Signed)
 Date/Time: 04/28/2024 10:41 AM  Performed by: Viviana Almarie DASEN, CRNAAirway Equipment and Method: Oral airway

## 2024-04-28 NOTE — H&P (Signed)
 Dawn Rivers is an 38 y.o.G43P2002 female here for pfannestial incision revision due to incision pain and cosmesis following cesarean section .   Pertinent Gynecological History: Menses: regular  Bleeding: normal flow Contraception: none DES exposure: denies Blood transfusions: none Sexually transmitted diseases: no past history Previous GYN Procedures: n/a  Last pap: normal Date: 07/22/20 OB History: G2, P2002   Menstrual History: Menarche age: 33 Patient's last menstrual period was 04/18/2024 (exact date).    Past Medical History:  Diagnosis Date   Acute neck pain 06/24/2019   Chicken pox    Elevated LFTs 01/24/2016   GERD (gastroesophageal reflux disease)    Gestational diabetes    PONV (postoperative nausea and vomiting)    Positive TB test    Postpartum care following cesarean delivery 03/15/2022   Urinary tract infection     Past Surgical History:  Procedure Laterality Date   CESAREAN SECTION     CESAREAN SECTION WITH BILATERAL TUBAL LIGATION Bilateral 03/15/2022   Procedure: CESAREAN SECTION WITH BILATERAL TUBAL LIGATION;  Surgeon: Delana Ted Morrison, DO;  Location: [facility];  Service: Obstetrics;  Laterality: Bilateral;  request RNFA or 2nd scrub tech    Family History  Problem Relation Age of Onset   Hypertension Mother    Hypertension Father    Diabetes Father    Hyperlipidemia Father    Sleep apnea Father    GER disease Father    Hypertension Brother     Social History:  reports that she has been smoking cigarettes. She has never used smokeless tobacco. She reports that she does not drink alcohol and does not use drugs.  Allergies: No Known Allergies  No medications prior to admission.    Review of Systems  Constitutional:  Negative for activity change and fatigue.  Eyes:  Negative for visual disturbance.  Respiratory:  Negative for chest tightness and shortness of breath.   Gastrointestinal:  Positive for abdominal pain. Negative for  abdominal distention.  Genitourinary:  Negative for pelvic pain and vaginal bleeding.  Musculoskeletal:  Negative for back pain.  Neurological:  Negative for dizziness.  Psychiatric/Behavioral:  The patient is not nervous/anxious.     Height 5' (1.524 m), weight 52.6 kg, last menstrual period 04/18/2024, not currently breastfeeding. Physical Exam Vitals and nursing note reviewed. Exam conducted with a chaperone present.  Constitutional:      Appearance: Normal appearance. She is normal weight.  Cardiovascular:     Pulses: Normal pulses.  Pulmonary:     Effort: Pulmonary effort is normal.  Genitourinary:    General: Normal vulva.  Musculoskeletal:        General: Normal range of motion.     Cervical back: Normal range of motion.  Skin:    General: Skin is warm and dry.     Capillary Refill: Capillary refill takes 2 to 3 seconds.  Neurological:     General: No focal deficit present.     Mental Status: She is alert and oriented to person, place, and time. Mental status is at baseline.  Psychiatric:        Mood and Affect: Mood normal.        Behavior: Behavior normal.        Thought Content: Thought content normal.        Judgment: Judgment normal.     No results found for this or any previous visit (from the past 24 hours).  No results found.  Assessment/Plan: 38yo [id number] with pain from cesarean section incision here  for revision  -Admit - ERAS protocol  - Consent signed  - To OR when ready   Ted ORN Diannah Rindfleisch 04/28/2024, 7:34 AM

## 2024-04-28 NOTE — Anesthesia Postprocedure Evaluation (Signed)
 Anesthesia Post Note  Patient: Dawn Rivers  Procedure(s) Performed: REVISION, SCAR (Abdomen)     Patient location during evaluation: PACU Anesthesia Type: MAC Level of consciousness: awake and alert Pain management: pain level controlled Vital Signs Assessment: post-procedure vital signs reviewed and stable Respiratory status: spontaneous breathing, nonlabored ventilation, respiratory function stable and patient connected to nasal cannula oxygen Cardiovascular status: stable and blood pressure returned to baseline Postop Assessment: no apparent nausea or vomiting Anesthetic complications: no   No notable events documented.  Last Vitals:  Vitals:   04/28/24 1200 04/28/24 1225  BP: 102/63   Pulse: 61 67  Resp: 16 13  Temp:    SpO2: 100% 98%    Last Pain:  Vitals:   04/28/24 1145  TempSrc:   PainSc: Asleep                 Jerrin Recore

## 2024-04-28 NOTE — Transfer of Care (Signed)
 Immediate Anesthesia Transfer of Care Note  Patient: Yuritza Paulhus  Procedure(s) Performed: REVISION, SCAR (Abdomen)  Patient Location: PACU  Anesthesia Type:MAC  Level of Consciousness: awake and alert   Airway & Oxygen Therapy: Patient Spontanous Breathing  Post-op Assessment: Report given to RN and Post -op Vital signs reviewed and stable  Post vital signs: Reviewed and stable  Last Vitals:  Vitals Value Taken Time  BP 100/53 04/28/24 11:20  Temp    Pulse 79 04/28/24 11:22  Resp 18 04/28/24 11:22  SpO2 99 % 04/28/24 11:22  Vitals shown include unfiled device data.  Last Pain:  Vitals:   04/28/24 0819  TempSrc: Oral  PainSc: 0-No pain      Patients Stated Pain Goal: 4 (04/28/24 0819)  Complications: No notable events documented.

## 2024-04-28 NOTE — Op Note (Signed)
 Operative Note    Preoperative Diagnosis Incision and abdominal pain at pfannestial incision site  Scar tissue at incision site    Postoperative Diagnosis: Same    Procedure: Abdominal scan revision    Surgeon: Delana, C DO   Anesthesia: LMA and 0.25% marcaine    Fluids: LR EBL: 10ml UOP: voided prior to OR  Meds: 5ml kenalog  in  20ml LR and 10ml 0.25% marcaine   Findings: Severe scarring under pfannestial incision with resultant puckering and pulling of overlying skin   Specimen: none   Procedure Note Consent reviewed and confirmed in periop area.  Patient was taken to the operating room where MAC anesthesia was administered  This was subsequently changed to LMA. Pt tolerated well She was then prepped and draped in the normal sterile fashion in the dorsal supine position with her arms outstretched.   An appropriate timeout was performed.   Attention was then turned to the patient's abdomen after draping. The previous low transverse pfannesteal incision was noted to have several areas of puckering and puffiness. An area about 2cm above the mid left part of the incision in the lower abdominal area was also noted to be indented.  15ml of 0.25ml marcaine  was injected to obtain a local block.  An incision was then made through the previous incision and carried to the level of the fascia. Small bleeding sites were cauterized for hemostasis.  The subcutaneous tissue was then undermined to loosen the scarring. This was extended to involve the area of indentation above the incision. Gross reapproximated noted improved cosmesis and palpation confirmed significant reduction in underlying scarring.  Next, the fascia was reinforced with 0-vicryl suture. The subcutaneous layer was reapproximated with 3-0 monocryl and the skin repaired with 4-0 vicryl on a keith needle.  An injection of 5ml kenalog  with 10ml 0.25% marcaine  and 20ml LR was injected into the repaired areas to help with  recovery.  Great gross  cosmesis noted  All counts correct per nursing staff x 2  Patient was then awakened and taken to the recovery room in good condition.

## 2024-04-29 ENCOUNTER — Encounter (HOSPITAL_COMMUNITY): Payer: Self-pay | Admitting: Obstetrics and Gynecology

## 2024-05-16 ENCOUNTER — Other Ambulatory Visit: Payer: Self-pay | Admitting: Primary Care

## 2024-05-16 DIAGNOSIS — K219 Gastro-esophageal reflux disease without esophagitis: Secondary | ICD-10-CM

## 2024-05-16 MED ORDER — FAMOTIDINE 20 MG PO TABS: 20.0000 mg | ORAL_TABLET | Freq: Every day | ORAL | 1 refills | Status: DC
